# Patient Record
Sex: Female | Born: 1970 | Race: Black or African American | Hispanic: No | Marital: Single | Smoking: Current every day smoker
Health system: Southern US, Community
[De-identification: ages and names within clinical notes are randomized; demographics above are authoritative.]

## PROBLEM LIST (undated history)

## (undated) DIAGNOSIS — Z8719 Personal history of other diseases of the digestive system: Secondary | ICD-10-CM

## (undated) DIAGNOSIS — Z87898 Personal history of other specified conditions: Secondary | ICD-10-CM

## (undated) DIAGNOSIS — I10 Essential (primary) hypertension: Secondary | ICD-10-CM

## (undated) DIAGNOSIS — F32A Depression, unspecified: Secondary | ICD-10-CM

## (undated) DIAGNOSIS — K5900 Constipation, unspecified: Secondary | ICD-10-CM

## (undated) DIAGNOSIS — F329 Major depressive disorder, single episode, unspecified: Secondary | ICD-10-CM

## (undated) HISTORY — DX: Personal history of other diseases of the digestive system: Z87.19

## (undated) HISTORY — DX: Constipation, unspecified: K59.00

## (undated) HISTORY — DX: Personal history of other specified conditions: Z87.898

---

## 1998-11-30 ENCOUNTER — Ambulatory Visit (HOSPITAL_COMMUNITY): Admission: RE | Admit: 1998-11-30 | Discharge: 1998-11-30 | Payer: Self-pay | Admitting: *Deleted

## 1999-01-26 ENCOUNTER — Ambulatory Visit (HOSPITAL_COMMUNITY): Admission: RE | Admit: 1999-01-26 | Discharge: 1999-01-26 | Payer: Self-pay | Admitting: *Deleted

## 1999-04-04 ENCOUNTER — Encounter: Payer: Self-pay | Admitting: Emergency Medicine

## 1999-04-04 ENCOUNTER — Encounter: Payer: Self-pay | Admitting: *Deleted

## 1999-04-04 ENCOUNTER — Observation Stay (HOSPITAL_COMMUNITY): Admission: AD | Admit: 1999-04-04 | Discharge: 1999-04-05 | Payer: Self-pay | Admitting: *Deleted

## 1999-04-05 ENCOUNTER — Encounter: Payer: Self-pay | Admitting: *Deleted

## 1999-06-06 ENCOUNTER — Inpatient Hospital Stay (HOSPITAL_COMMUNITY): Admission: AD | Admit: 1999-06-06 | Discharge: 1999-06-09 | Payer: Self-pay | Admitting: *Deleted

## 1999-06-06 ENCOUNTER — Encounter (HOSPITAL_COMMUNITY): Admission: RE | Admit: 1999-06-06 | Discharge: 1999-06-08 | Payer: Self-pay | Admitting: *Deleted

## 2000-07-17 ENCOUNTER — Inpatient Hospital Stay (HOSPITAL_COMMUNITY): Admission: AD | Admit: 2000-07-17 | Discharge: 2000-07-17 | Payer: Self-pay | Admitting: Obstetrics

## 2000-09-29 ENCOUNTER — Inpatient Hospital Stay (HOSPITAL_COMMUNITY): Admission: AD | Admit: 2000-09-29 | Discharge: 2000-09-29 | Payer: Self-pay | Admitting: *Deleted

## 2000-09-29 ENCOUNTER — Encounter: Payer: Self-pay | Admitting: *Deleted

## 2001-02-11 ENCOUNTER — Inpatient Hospital Stay (HOSPITAL_COMMUNITY): Admission: AD | Admit: 2001-02-11 | Discharge: 2001-02-11 | Payer: Self-pay | Admitting: *Deleted

## 2001-02-17 ENCOUNTER — Inpatient Hospital Stay (HOSPITAL_COMMUNITY): Admission: AD | Admit: 2001-02-17 | Discharge: 2001-02-21 | Payer: Self-pay | Admitting: *Deleted

## 2001-04-02 ENCOUNTER — Encounter: Payer: Self-pay | Admitting: Emergency Medicine

## 2001-04-02 ENCOUNTER — Emergency Department (HOSPITAL_COMMUNITY): Admission: EM | Admit: 2001-04-02 | Discharge: 2001-04-02 | Payer: Self-pay | Admitting: Emergency Medicine

## 2002-09-11 ENCOUNTER — Emergency Department (HOSPITAL_COMMUNITY): Admission: EM | Admit: 2002-09-11 | Discharge: 2002-09-11 | Payer: Self-pay

## 2002-11-11 ENCOUNTER — Ambulatory Visit (HOSPITAL_COMMUNITY): Admission: RE | Admit: 2002-11-11 | Discharge: 2002-11-11 | Payer: Self-pay | Admitting: *Deleted

## 2003-02-25 ENCOUNTER — Inpatient Hospital Stay (HOSPITAL_COMMUNITY): Admission: AD | Admit: 2003-02-25 | Discharge: 2003-02-25 | Payer: Self-pay | Admitting: Obstetrics & Gynecology

## 2003-02-26 ENCOUNTER — Inpatient Hospital Stay (HOSPITAL_COMMUNITY): Admission: AD | Admit: 2003-02-26 | Discharge: 2003-02-26 | Payer: Self-pay | Admitting: *Deleted

## 2003-03-03 ENCOUNTER — Inpatient Hospital Stay (HOSPITAL_COMMUNITY): Admission: AD | Admit: 2003-03-03 | Discharge: 2003-03-03 | Payer: Self-pay | Admitting: Obstetrics and Gynecology

## 2003-03-06 ENCOUNTER — Inpatient Hospital Stay (HOSPITAL_COMMUNITY): Admission: AD | Admit: 2003-03-06 | Discharge: 2003-03-10 | Payer: Self-pay | Admitting: *Deleted

## 2003-08-11 ENCOUNTER — Emergency Department (HOSPITAL_COMMUNITY): Admission: EM | Admit: 2003-08-11 | Discharge: 2003-08-11 | Payer: Self-pay | Admitting: Emergency Medicine

## 2004-01-19 ENCOUNTER — Ambulatory Visit: Payer: Self-pay | Admitting: *Deleted

## 2004-02-09 ENCOUNTER — Ambulatory Visit (HOSPITAL_COMMUNITY): Admission: RE | Admit: 2004-02-09 | Discharge: 2004-02-09 | Payer: Self-pay | Admitting: *Deleted

## 2004-02-09 ENCOUNTER — Ambulatory Visit: Payer: Self-pay | Admitting: *Deleted

## 2004-03-08 ENCOUNTER — Ambulatory Visit: Payer: Self-pay | Admitting: *Deleted

## 2004-03-23 ENCOUNTER — Ambulatory Visit: Payer: Self-pay | Admitting: Family Medicine

## 2004-03-24 ENCOUNTER — Ambulatory Visit: Payer: Self-pay | Admitting: *Deleted

## 2004-04-18 ENCOUNTER — Encounter: Admission: RE | Admit: 2004-04-18 | Discharge: 2004-04-18 | Payer: Self-pay | Admitting: Nephrology

## 2004-04-20 ENCOUNTER — Ambulatory Visit: Payer: Self-pay | Admitting: Family Medicine

## 2004-05-04 ENCOUNTER — Ambulatory Visit: Payer: Self-pay | Admitting: Family Medicine

## 2004-05-05 ENCOUNTER — Ambulatory Visit: Payer: Self-pay | Admitting: Family Medicine

## 2004-05-11 ENCOUNTER — Ambulatory Visit (HOSPITAL_COMMUNITY): Admission: RE | Admit: 2004-05-11 | Discharge: 2004-05-11 | Payer: Self-pay | Admitting: *Deleted

## 2004-05-11 ENCOUNTER — Ambulatory Visit: Payer: Self-pay | Admitting: Family Medicine

## 2004-06-01 ENCOUNTER — Ambulatory Visit: Payer: Self-pay | Admitting: *Deleted

## 2004-06-01 ENCOUNTER — Inpatient Hospital Stay (HOSPITAL_COMMUNITY): Admission: AD | Admit: 2004-06-01 | Discharge: 2004-06-05 | Payer: Self-pay | Admitting: *Deleted

## 2004-06-01 ENCOUNTER — Ambulatory Visit: Payer: Self-pay | Admitting: Obstetrics & Gynecology

## 2004-06-02 ENCOUNTER — Encounter (INDEPENDENT_AMBULATORY_CARE_PROVIDER_SITE_OTHER): Payer: Self-pay | Admitting: Specialist

## 2006-03-21 ENCOUNTER — Ambulatory Visit: Payer: Self-pay | Admitting: Obstetrics & Gynecology

## 2006-03-22 ENCOUNTER — Ambulatory Visit: Payer: Self-pay | Admitting: Gynecology

## 2006-03-26 ENCOUNTER — Ambulatory Visit (HOSPITAL_COMMUNITY): Admission: RE | Admit: 2006-03-26 | Discharge: 2006-03-26 | Payer: Self-pay | Admitting: Obstetrics & Gynecology

## 2006-04-11 ENCOUNTER — Ambulatory Visit: Payer: Self-pay | Admitting: Family Medicine

## 2006-04-11 ENCOUNTER — Ambulatory Visit (HOSPITAL_COMMUNITY): Admission: RE | Admit: 2006-04-11 | Discharge: 2006-04-11 | Payer: Self-pay | Admitting: Family Medicine

## 2006-04-12 ENCOUNTER — Ambulatory Visit: Payer: Self-pay | Admitting: *Deleted

## 2006-04-12 ENCOUNTER — Inpatient Hospital Stay (HOSPITAL_COMMUNITY): Admission: AD | Admit: 2006-04-12 | Discharge: 2006-04-13 | Payer: Self-pay | Admitting: Obstetrics and Gynecology

## 2006-04-13 ENCOUNTER — Encounter (INDEPENDENT_AMBULATORY_CARE_PROVIDER_SITE_OTHER): Payer: Self-pay | Admitting: Specialist

## 2006-04-24 ENCOUNTER — Ambulatory Visit: Payer: Self-pay | Admitting: Gynecology

## 2006-12-18 ENCOUNTER — Inpatient Hospital Stay (HOSPITAL_COMMUNITY): Admission: AD | Admit: 2006-12-18 | Discharge: 2006-12-19 | Payer: Self-pay | Admitting: Obstetrics and Gynecology

## 2006-12-31 ENCOUNTER — Ambulatory Visit (HOSPITAL_COMMUNITY): Admission: RE | Admit: 2006-12-31 | Discharge: 2006-12-31 | Payer: Self-pay | Admitting: Obstetrics & Gynecology

## 2007-01-06 ENCOUNTER — Ambulatory Visit: Payer: Self-pay | Admitting: Obstetrics & Gynecology

## 2007-01-23 ENCOUNTER — Ambulatory Visit: Payer: Self-pay | Admitting: Obstetrics & Gynecology

## 2007-01-24 ENCOUNTER — Ambulatory Visit: Payer: Self-pay | Admitting: Obstetrics and Gynecology

## 2007-02-03 ENCOUNTER — Ambulatory Visit (HOSPITAL_COMMUNITY): Admission: RE | Admit: 2007-02-03 | Discharge: 2007-02-03 | Payer: Self-pay | Admitting: Obstetrics & Gynecology

## 2007-02-27 ENCOUNTER — Ambulatory Visit: Payer: Self-pay | Admitting: Obstetrics & Gynecology

## 2007-03-06 ENCOUNTER — Ambulatory Visit (HOSPITAL_COMMUNITY): Admission: RE | Admit: 2007-03-06 | Discharge: 2007-03-06 | Payer: Self-pay | Admitting: Obstetrics & Gynecology

## 2007-04-17 ENCOUNTER — Ambulatory Visit: Payer: Self-pay | Admitting: Obstetrics & Gynecology

## 2007-05-01 ENCOUNTER — Ambulatory Visit: Payer: Self-pay | Admitting: Obstetrics & Gynecology

## 2007-05-02 ENCOUNTER — Ambulatory Visit: Payer: Self-pay | Admitting: Obstetrics and Gynecology

## 2007-05-07 ENCOUNTER — Inpatient Hospital Stay (HOSPITAL_COMMUNITY): Admission: AD | Admit: 2007-05-07 | Discharge: 2007-05-10 | Payer: Self-pay | Admitting: Obstetrics & Gynecology

## 2007-05-07 ENCOUNTER — Encounter (INDEPENDENT_AMBULATORY_CARE_PROVIDER_SITE_OTHER): Payer: Self-pay | Admitting: Gynecology

## 2007-05-07 ENCOUNTER — Ambulatory Visit: Payer: Self-pay | Admitting: Gynecology

## 2008-10-28 ENCOUNTER — Ambulatory Visit: Payer: Self-pay | Admitting: Obstetrics & Gynecology

## 2008-10-29 ENCOUNTER — Encounter: Payer: Self-pay | Admitting: Family Medicine

## 2008-10-29 ENCOUNTER — Ambulatory Visit (HOSPITAL_COMMUNITY): Admission: RE | Admit: 2008-10-29 | Discharge: 2008-10-29 | Payer: Self-pay | Admitting: Family Medicine

## 2008-11-18 ENCOUNTER — Ambulatory Visit: Payer: Self-pay | Admitting: Obstetrics & Gynecology

## 2008-11-26 ENCOUNTER — Ambulatory Visit (HOSPITAL_COMMUNITY): Admission: RE | Admit: 2008-11-26 | Discharge: 2008-11-26 | Payer: Self-pay | Admitting: Family Medicine

## 2008-11-26 ENCOUNTER — Encounter: Payer: Self-pay | Admitting: Family Medicine

## 2008-12-17 ENCOUNTER — Ambulatory Visit (HOSPITAL_COMMUNITY): Admission: RE | Admit: 2008-12-17 | Discharge: 2008-12-17 | Payer: Self-pay | Admitting: Family Medicine

## 2008-12-30 ENCOUNTER — Encounter: Payer: Self-pay | Admitting: Family Medicine

## 2008-12-30 ENCOUNTER — Ambulatory Visit: Payer: Self-pay | Admitting: Obstetrics & Gynecology

## 2009-01-06 ENCOUNTER — Encounter: Payer: Self-pay | Admitting: Family Medicine

## 2009-01-06 ENCOUNTER — Ambulatory Visit (HOSPITAL_COMMUNITY): Admission: RE | Admit: 2009-01-06 | Discharge: 2009-01-06 | Payer: Self-pay | Admitting: Family Medicine

## 2009-02-17 ENCOUNTER — Ambulatory Visit (HOSPITAL_COMMUNITY): Admission: RE | Admit: 2009-02-17 | Discharge: 2009-02-17 | Payer: Self-pay | Admitting: Family Medicine

## 2009-02-17 ENCOUNTER — Encounter: Payer: Self-pay | Admitting: Family Medicine

## 2009-02-21 ENCOUNTER — Inpatient Hospital Stay (HOSPITAL_COMMUNITY): Admission: AD | Admit: 2009-02-21 | Discharge: 2009-02-21 | Payer: Self-pay | Admitting: Obstetrics & Gynecology

## 2009-02-21 ENCOUNTER — Ambulatory Visit: Payer: Self-pay | Admitting: Advanced Practice Midwife

## 2009-02-21 ENCOUNTER — Ambulatory Visit: Payer: Self-pay | Admitting: Obstetrics & Gynecology

## 2009-03-10 ENCOUNTER — Ambulatory Visit: Payer: Self-pay | Admitting: Obstetrics & Gynecology

## 2009-03-10 ENCOUNTER — Encounter: Payer: Self-pay | Admitting: Family Medicine

## 2009-03-10 ENCOUNTER — Encounter: Payer: Self-pay | Admitting: Obstetrics & Gynecology

## 2009-03-10 LAB — CONVERTED CEMR LAB
MCHC: 32.2 g/dL (ref 30.0–36.0)
MCV: 87.5 fL (ref 78.0–100.0)
Platelets: 350 10*3/uL (ref 150–400)
WBC: 8.4 10*3/uL (ref 4.0–10.5)

## 2009-03-11 ENCOUNTER — Ambulatory Visit: Payer: Self-pay | Admitting: Obstetrics and Gynecology

## 2009-03-31 ENCOUNTER — Ambulatory Visit: Payer: Self-pay | Admitting: Family

## 2009-03-31 ENCOUNTER — Ambulatory Visit: Payer: Self-pay | Admitting: Family Medicine

## 2009-03-31 ENCOUNTER — Inpatient Hospital Stay (HOSPITAL_COMMUNITY): Admission: AD | Admit: 2009-03-31 | Discharge: 2009-04-03 | Payer: Self-pay | Admitting: Obstetrics & Gynecology

## 2009-04-01 ENCOUNTER — Encounter: Payer: Self-pay | Admitting: Obstetrics & Gynecology

## 2009-05-02 ENCOUNTER — Ambulatory Visit: Payer: Self-pay | Admitting: Family Medicine

## 2009-05-02 ENCOUNTER — Ambulatory Visit: Payer: Self-pay | Admitting: Obstetrics and Gynecology

## 2009-05-02 ENCOUNTER — Encounter: Payer: Self-pay | Admitting: Family Medicine

## 2009-05-02 ENCOUNTER — Observation Stay (HOSPITAL_COMMUNITY): Admission: AD | Admit: 2009-05-02 | Discharge: 2009-05-02 | Payer: Self-pay | Admitting: Family Medicine

## 2010-01-29 ENCOUNTER — Encounter: Payer: Self-pay | Admitting: *Deleted

## 2010-03-28 LAB — RAPID URINE DRUG SCREEN, HOSP PERFORMED
Amphetamines: NOT DETECTED
Opiates: NOT DETECTED
Tetrahydrocannabinol: NOT DETECTED

## 2010-03-28 LAB — COMPREHENSIVE METABOLIC PANEL
AST: 23 U/L (ref 0–37)
Albumin: 2.6 g/dL — ABNORMAL LOW (ref 3.5–5.2)
Alkaline Phosphatase: 165 U/L — ABNORMAL HIGH (ref 39–117)
Chloride: 104 mEq/L (ref 96–112)
GFR calc Af Amer: >60 mL/min (ref >60)
Potassium: 4.2 mEq/L (ref 3.5–5.1)
Sodium: 134 mEq/L — ABNORMAL LOW (ref 135–145)
Total Bilirubin: 0.3 mg/dL (ref 0.3–1.2)

## 2010-03-28 LAB — CBC
Platelets: 287 10*3/uL (ref 150–400)
WBC: 7.8 10*3/uL (ref 4.0–10.5)

## 2010-03-28 LAB — POCT URINALYSIS DIP (DEVICE)
Protein, ur: 30 mg/dL — AB
Urobilinogen, UA: 0.2 mg/dL (ref 0.0–1.0)

## 2010-03-31 LAB — URINALYSIS, ROUTINE W REFLEX MICROSCOPIC
Bilirubin Urine: NEGATIVE
Nitrite: NEGATIVE
Specific Gravity, Urine: 1.025 (ref 1.005–1.030)
Urobilinogen, UA: 0.2 mg/dL (ref 0.0–1.0)

## 2010-04-03 LAB — CBC
HCT: 27.5 % — ABNORMAL LOW (ref 36.0–46.0)
HCT: 30.3 % — ABNORMAL LOW (ref 36.0–46.0)
Hemoglobin: 10.2 g/dL — ABNORMAL LOW (ref 12.0–15.0)
Hemoglobin: 9.3 g/dL — ABNORMAL LOW (ref 12.0–15.0)
MCHC: 33.6 g/dL (ref 30.0–36.0)
MCHC: 33.7 g/dL (ref 30.0–36.0)
MCV: 88.3 fL (ref 78.0–100.0)
MCV: 88.4 fL (ref 78.0–100.0)
RDW: 14.1 % (ref 11.5–15.5)
RDW: 14.6 % (ref 11.5–15.5)

## 2010-04-03 LAB — COMPREHENSIVE METABOLIC PANEL
BUN: 4 mg/dL — ABNORMAL LOW (ref 6–23)
BUN: 8 mg/dL (ref 6–23)
Calcium: 7.4 mg/dL — ABNORMAL LOW (ref 8.4–10.5)
Calcium: 9 mg/dL (ref 8.4–10.5)
Creatinine, Ser: 0.46 mg/dL (ref 0.4–1.2)
GFR calc non Af Amer: >60 mL/min (ref >60)
Glucose, Bld: 90 mg/dL (ref 70–99)
Glucose, Bld: 97 mg/dL (ref 70–99)
Sodium: 134 mEq/L — ABNORMAL LOW (ref 135–145)
Total Protein: 5.4 g/dL — ABNORMAL LOW (ref 6.0–8.3)
Total Protein: 6.1 g/dL (ref 6.0–8.3)

## 2010-04-03 LAB — POCT URINALYSIS DIP (DEVICE)
Bilirubin Urine: NEGATIVE
Glucose, UA: NEGATIVE mg/dL
Hgb urine dipstick: NEGATIVE
Nitrite: NEGATIVE
Nitrite: NEGATIVE
Protein, ur: 30 mg/dL — AB
Urobilinogen, UA: 0.2 mg/dL (ref 0.0–1.0)
Urobilinogen, UA: 1 mg/dL (ref 0.0–1.0)
pH: 5.5 (ref 5.0–8.0)
pH: 5.5 (ref 5.0–8.0)

## 2010-04-03 LAB — PROTEIN, URINE, 24 HOUR: Collection Interval-UPROT: 24 hours

## 2010-04-03 LAB — CREATININE, URINE, RANDOM: Creatinine, Urine: 88.5 mg/dL

## 2010-04-03 LAB — RAPID URINE DRUG SCREEN, HOSP PERFORMED
Barbiturates: NOT DETECTED
Benzodiazepines: POSITIVE — AB

## 2010-04-03 LAB — CREATININE CLEARANCE, URINE, 24 HOUR
Collection Interval-CRCL: 24 hours
Creatinine, 24H Ur: 1662 mg/d (ref 700–1800)
Urine Total Volume-CRCL: 2650 mL

## 2010-04-10 LAB — POCT URINALYSIS DIP (DEVICE)
Bilirubin Urine: NEGATIVE
Glucose, UA: NEGATIVE mg/dL
Hgb urine dipstick: NEGATIVE
Specific Gravity, Urine: >=1.03 (ref 1.005–1.030)

## 2010-04-12 LAB — POCT URINALYSIS DIP (DEVICE)
Bilirubin Urine: NEGATIVE
Glucose, UA: NEGATIVE mg/dL
Ketones, ur: NEGATIVE mg/dL
pH: 5.5 (ref 5.0–8.0)

## 2010-04-13 LAB — POCT URINALYSIS DIP (DEVICE)
Bilirubin Urine: NEGATIVE
Ketones, ur: NEGATIVE mg/dL
Protein, ur: NEGATIVE mg/dL
Specific Gravity, Urine: >=1.03 (ref 1.005–1.030)
pH: 5.5 (ref 5.0–8.0)

## 2010-05-23 NOTE — Op Note (Signed)
NAME:  [name]             ACCOUNT NO.:  [id number]   MEDICAL RECORD NO.:  [id number]          PATIENT TYPE:  INP   LOCATION:                                FACILITY:  [facility]   PHYSICIAN:  Ginger Carne, MD  DATE OF BIRTH:  Jun 17, 1970   DATE OF PROCEDURE:  05/07/2007  DATE OF DISCHARGE:                               OPERATIVE REPORT   PREOPERATIVE DIAGNOSES:  1. Intrauterine pregnancy at 38 weeks and 4 days' gestation.  2. Active labor with completely dilated.  3. Breech presentation  4. Chronic hypertension.  5. Advance maternal age.  6. Group B streptococcus unknown.  7. History of preterm labor.   POSTOPERATIVE DIAGNOSES:  1. Intrauterine pregnancy at 38 weeks and 4 days' gestation.  2. Active labor with completely dilated cervix  3. Breech presentation.  4. Chronic hypertension.  5. Advance maternal age.  6. Group B streptococcus unknown.  7. History of preterm labor.   PROCEDURE:  Primary low-transverse cesarean section.   SURGEON:  Ginger Carne, MD   ASSISTANT:  Karlton Lemon, MD   ANESTHESIA:  Spinal.   FINDINGS:  1. Viable infant female, weight unavailable at time of dictation.      Apgars 5 at 1 minute and 8 at 5 minutes.  Cord pH was 7.33.  In      footling breech presentation.  2. Clear amniotic fluid.  3. Normal female pelvic anatomy.   ESTIMATED BLOOD LOSS:  800 mL.   DRAINS:  Foley with clear yellow urine.   COMPLICATIONS:  None immediate.   SPECIMENS:  1. Placenta to the pathology.  2. Cord pH to the lab.  3. Cord blood to the lab.   INDICATIONS FOR PROCEDURE:  [name] is a 40 year old gravida 68,  para 3-3-4-6 at 20 weeks and 4 days' gestation presenting with  complaints of labor.  She was evaluated in the maternity admission unit  and found to be completely dilated with a footling breech presentation.  Due to this presentation, the patient was taken for primary low-  transverse cesarean section.   DESCRIPTION OF  PROCEDURE:  The patient was taken to the operating room  and after obtaining adequate spinal anesthesia, she was prepped and  draped in the usual sterile manner in the supine position with left  lateral uterine displacement.  After ensuring adequate anesthesia, a  Pfannenstiel skin incision was made using the scalpel.  The incision was  carried down through the subcutaneous tissues using the scalpel.  The  rectus fascia was nicked in the midline, and the incision was extended  laterally in its direction using the Mayo scissors.  The rectus muscle  was dissected free of the fascia using both sharp and blunt dissection.  The rectus muscles were then separated bluntly.  The parietal peritoneum  was identified, grasped between the 2 hemostats, elevated and entered  inferiorly under direct visualization with Metzenbaum scissors and blunt  dissection.  A bladder blade was placed and reflection of the visceral  peritoneum superior to the bladder was identified, elevated, and incised  using Metzenbaum scissors, the incision  then extended laterally in each  direction.  Bladder flap was created using blunt dissection and  retracted with the bladder blade.  A low-transverse uterine incision was  made using a scalpel.  The incision was extended laterally  and  superiorly using blunt dissections.  Hand was placed within the uterine  cavity and the infant's legs were identified and delivered without  difficulty.  The infant's torso was then delivered.  The infant's torso  was then rotated, and the infant's right arm was rotated, was swept  across the infant's abdomen and out of the uterine cavity.  The infant  was rotated back to the other side and left arm was delivered in a  similar fashion.  The infant's head followed without difficulty.  The  infant was bulb suctioned after delivery of the head.  Cord was doubly  clamped and cut.  The infant handed to the nursery team in attendance.  Specimens  collected for cord pH and blood, and the placenta was  delivered manually and appeared intact.  The endometrial cavity was then  wiped free of any trace of membranes using wet laparotomy sponges.  The  uterine incision was then closed in a running locking fashion with 0  Vicryl.  The areas of bleeding were controlled with 2 stitches of 0  Vicryl in a figure-of-eight fashion.  The uterine incision was inspected  and found to have good hemostasis.  The rectus muscles were inspected  and found to have adequate hemostasis.  The rectus fascia was then  closed with a single stitch of looped PDS in a running unlocked fashion.  Subcutaneous tissue was inspected and found to have good hemostasis.  The skin was reapproximated with stainless steel skin staples.  Pressure  dressing was applied to the surgical incision.  Sponge, needle, and  instrument counts were correct x3.  The patient was sent to  postanesthesia care unit in stable condition.      Karlton Lemon, MD  Electronically Signed     ______________________________  Ginger Carne, MD    NS/MEDQ  D:  05/07/2007  T:  05/08/2007  Job:  [id number]

## 2010-05-23 NOTE — Discharge Summary (Signed)
NAME:  [name], [name]             ACCOUNT NO.:  [id number]   MEDICAL RECORD NO.:  [id number]          PATIENT TYPE:  INP   LOCATION:  9134                          [facility]ACILITY:  [facility]   PHYSICIAN:  Tanya S. Shawnie Pons, M.D.   DATE OF BIRTH:  04/25/1970   DATE OF ADMISSION:  05/07/2007  DATE OF DISCHARGE:  05/10/2007                               DISCHARGE SUMMARY   The patient is a 40 year old presented as a G11, P 3-3-4-6 on May 07, 2007, in active labor at 30 weeks and 4 days with a footling breech  presentation.  The patient had emergent cesarean section for that same  reason that was performed by Dr. Mia Creek and Karlton Lemon.  A female  infant was born of that cesarean section and details of that surgery can  be found in the dictated discharge summary.  The patient has had an  uneventful postpartum course and postoperative course throughout her  stay.  She is being discharged on postoperative day #3.  Status post a  primary low-transverse cesarean section at 30 weeks and 4 days for  footling breech.  She is in stable status.  Her staples removed by the  baby love nurse.  She is receiving prescriptions for ibuprofen and  Percocet and was instructed to continue her prenatal vitamins for the  duration of her pumping and breast-feeding.  She is receiving Depo-  Provera prior to discharge and plans to receive Mirena IUD, which will  be placed at [facility] at her 6-week visit.  She was  given instructions for following up at [facility] in 6 weeks and she  had a 30-week infant that is stable currently in the NICU, that will not  be discharged for several weeks.  Her discharge hemoglobin is 9.5 and  hematocrit is 27.5.  The patient was also given a prescription for iron  as well because of her acute blood loss in surgery and new onset anemia.      Maylon Cos, C.N.M.      Shelbie Proctor. Shawnie Pons, M.D.  Electronically Signed    SS/MEDQ  D:  05/10/2007  T:  05/10/2007   Job:  [id number]

## 2010-05-26 NOTE — Discharge Summary (Signed)
[facility]  Patient:    [name], [name] Visit Number: [id number] MRN: [id number]          Service Type: OBS Location: 910A 9144 01 Attending Physician:  Michaelle Copas Dictated by:   Vear Clock, M.D. Admit Date:  02/17/2001 Discharge Date: 02/21/2001                             Discharge Summary  CHIEF COMPLAINT:              1. Early labor.                               2. Hypertension.  DISCHARGE MEDICATIONS:        1. Ibuprofen 600 mg p.o. q.6-8h. p.r.n. pain.                               2. Procardia XL 30 mg p.o. q.d.                               3. Depo-Provera 150 mg IM every three months.                               4. Prenatal vitamins 1 p.o. q.d. x6 weeks or                                  while breast feeding.                               5. Fergon 325 mg p.o. q.h.s. x6 weeks.  FOLLOWUP:                     The patient is to follow up at [facility] on March 31, 2001 at 9 a.m.  HISTORY OF PRESENT ILLNESS:   This is a 40 year old G7, P3-0-3-3 who presented at [redacted] weeks gestation complaining of contractions, membranes intact.  The patient had had painful contractions since 0300.  She receives her care at [facility].  The patient has a history of PIH, anemia, smoking, and GBS positivity.  The patient did have hypertension with her third pregnancy and with this pregnancy and history also significant for an abnormal Pap in 1992, questionable significant atypia versus benign.  The patient does smoke three cigarettes a day.  PRENATAL LABORATORY DATA:     O+, rubella immune, hepatitis B surface antigen negative, RPR negative, HIV declined, GC negative, chlamydia negative, GBS positive on November 15.  PHYSICAL EXAMINATION:  VITAL SIGNS:                  Stable except for elevated blood pressures of 163/97-153/109.  CARDIOVASCULAR:               Regular rate and rhythm.  LUNGS:                         Clear to auscultation bilaterally.  ABDOMEN:  Soft and nontender.  Mid right upper quadrant pain.  EXTREMITIES:                  Mild pedal edema.  NEUROLOGIC:                   DTRs 1+, no clonus.  PELVIS:                       Cervical exam 2-3, 60%, -2, and vertex.  Fetal heart rate with reactivity variability.  No decelerations.  Contracting on the monitor every 4-5 minutes.  LABORATORY DATA:              PIH labs were drawn at admission and were within normal limits.  UA did show some new proteinuria.  ASSESSMENT:                   A 40 year old G7, P3-0-3-3 at [redacted] weeks gestation in early labor with hypertension.  HOSPITAL COURSE:              The patient was admitted, began having discomfort with her contractions, did receive an epidural when she went into active labor, and magnesium sulfate was started at that time.  The patient progressed through labor well.  Delivered a viable female infant on February 10 at 11:43.  Apgars 9 and 9.  The patient was on magnesium for approximately 28 hours after delivery and did well after discontinuation of magnesium; however, did continue with some hypertension and headaches.  Baby had some difficulty in the postpartum period and was transferred to the NICU for two days, causing significant distress to mother, delaying her discharge for an additional two days secondary to continued hypertension.  The patient was started on Procardia XL 30 mg p.o. q.d. with improvement in her hypertension.  The patient is breast feeding and wants Depo-Provera prior to discharge.  The patient was discharged on hospital day #4 without further incident and will follow up at [facility] in six weeks. Dictated by:   Vear Clock, M.D. Attending Physician:  Michaelle Copas DD:  02/21/01 TD:  02/22/01 Job: [id number]

## 2010-05-26 NOTE — Discharge Summary (Signed)
NAME:  [name], [name]             ACCOUNT NO.:  [id number]   MEDICAL RECORD NO.:  [id number]          PATIENT TYPE:  WOC   LOCATION:  WOC                          FACILITY:  [facility]   PHYSICIAN:  Phil D. Okey Dupre, M.D.     DATE OF BIRTH:  [birth year]   DATE OF ADMISSION:  04/11/2006  DATE OF DISCHARGE:  04/13/2006                               DISCHARGE SUMMARY   DISCHARGE DIAGNOSIS:  Intrauterine fetal demise at 43 weeks' gestation,  etiology unknown but likely a cord accident.   LABORATORY:  O positive.  Antibody negative.  Rubella immune.  Hep B  negative.  HIV nonreactive.  Pap negative.  Predelivery hemoglobin 12.1,  post delivery 9.8.   HOSPITAL COURSE:  A 40 year old G10, P6-3-4-6, at 52 weeks' gestational  age was seen for a routine OB visit and fetal heart tones could not be  found.  Followup ultrasound revealed an IUFD.  The patient was admitted  and given Cytotec x2 and went on to a vaginal delivery encaul nonviable  female at 4:30 p.m. on April 12, 2006, that was delivered by the nurse.  Per nurse report, there was a tight nuchal cord that was clamped and cut  by the father of the baby.  Three-vessel placenta was delivered by Dr.  Mayford Knife intact.  Bimanual done to clear the clots.  No D&C was  required.  The fetus appeared normal with no overt deformities or  anomalies.  The patient was initially requesting an autopsy but decided  against this given unlikely that this would reveal etiology other than  probable cord accident.  The patient has good support and was given  numbers for counseling services.  She desired to follow up with the  [facility] for her six-week postpartum check and  a telephone number was given for her to call.   She was advised of her postpartum recommendations including six weeks of  pelvic rest and her prenatal vitamin.  She desires to use OCPs and has  some Yaz at home that she was instructed to start on April 28, 2006.  All  questions were answered.     ______________________________  Lupita Raider, M.D.    ______________________________  Javier Glazier. Okey Dupre, M.D.    KS/MEDQ  D:  04/13/2006  T:  04/13/2006  Job:  [id number]
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
## 2010-09-29 LAB — POCT URINALYSIS DIP (DEVICE)
Bilirubin Urine: NEGATIVE
Operator id: 159681
Protein, ur: 30 — AB
Specific Gravity, Urine: >=1.03

## 2010-10-02 LAB — POCT URINALYSIS DIP (DEVICE)
Bilirubin Urine: NEGATIVE
Ketones, ur: NEGATIVE
Operator id: 297281
Protein, ur: 30 — AB
Specific Gravity, Urine: >=1.03

## 2010-10-03 LAB — CBC
HCT: 34.4 — ABNORMAL LOW
Hemoglobin: 9.5 — ABNORMAL LOW
MCHC: 34.6
MCV: 87.7
MCV: 88.2
Platelets: 323
RBC: 3.13 — ABNORMAL LOW
RBC: 3.9
WBC: 10.8 — ABNORMAL HIGH
WBC: 13.2 — ABNORMAL HIGH

## 2010-10-03 LAB — POCT URINALYSIS DIP (DEVICE)
Bilirubin Urine: NEGATIVE
Bilirubin Urine: NEGATIVE
Glucose, UA: NEGATIVE
Glucose, UA: NEGATIVE
Hgb urine dipstick: NEGATIVE
Hgb urine dipstick: NEGATIVE
Ketones, ur: NEGATIVE
Nitrite: NEGATIVE
Nitrite: NEGATIVE
Operator id: 148111
Operator id: 297281
Protein, ur: 30 — AB
Protein, ur: 30 — AB
Specific Gravity, Urine: 1.025
Specific Gravity, Urine: 1.025
Urobilinogen, UA: 1
Urobilinogen, UA: 0.2
pH: 6
pH: 6

## 2010-10-13 LAB — POCT URINALYSIS DIP (DEVICE)
Glucose, UA: NEGATIVE
Hgb urine dipstick: NEGATIVE
Ketones, ur: NEGATIVE
Operator id: 120861
Protein, ur: 30 — AB
Specific Gravity, Urine: >=1.03
Urobilinogen, UA: 0.2

## 2015-02-18 ENCOUNTER — Encounter (HOSPITAL_COMMUNITY): Payer: Self-pay | Admitting: *Deleted

## 2015-02-18 ENCOUNTER — Emergency Department (HOSPITAL_COMMUNITY)
Admission: EM | Admit: 2015-02-18 | Discharge: 2015-02-18 | Disposition: A | Discharge status: Home / Self Care | Payer: Medicaid Other | Attending: Emergency Medicine | Admitting: Emergency Medicine

## 2015-02-18 DIAGNOSIS — I10 Essential (primary) hypertension: Secondary | ICD-10-CM | POA: Insufficient documentation

## 2015-02-18 DIAGNOSIS — F17200 Nicotine dependence, unspecified, uncomplicated: Secondary | ICD-10-CM | POA: Diagnosis not present

## 2015-02-18 DIAGNOSIS — A5901 Trichomonal vulvovaginitis: Secondary | ICD-10-CM | POA: Insufficient documentation

## 2015-02-18 DIAGNOSIS — Z79899 Other long term (current) drug therapy: Secondary | ICD-10-CM | POA: Diagnosis not present

## 2015-02-18 DIAGNOSIS — Z9889 Other specified postprocedural states: Secondary | ICD-10-CM | POA: Insufficient documentation

## 2015-02-18 DIAGNOSIS — F329 Major depressive disorder, single episode, unspecified: Secondary | ICD-10-CM | POA: Insufficient documentation

## 2015-02-18 DIAGNOSIS — R000 Tachycardia, unspecified: Secondary | ICD-10-CM

## 2015-02-18 DIAGNOSIS — K5900 Constipation, unspecified: Secondary | ICD-10-CM | POA: Diagnosis not present

## 2015-02-18 DIAGNOSIS — Z3202 Encounter for pregnancy test, result negative: Secondary | ICD-10-CM | POA: Insufficient documentation

## 2015-02-18 DIAGNOSIS — R109 Unspecified abdominal pain: Secondary | ICD-10-CM | POA: Diagnosis present

## 2015-02-18 HISTORY — DX: Major depressive disorder, single episode, unspecified: F32.9

## 2015-02-18 HISTORY — DX: Essential (primary) hypertension: I10

## 2015-02-18 HISTORY — DX: Depression, unspecified: F32.A

## 2015-02-18 LAB — URINALYSIS, ROUTINE W REFLEX MICROSCOPIC
BILIRUBIN URINE: NEGATIVE
Glucose, UA: NEGATIVE mg/dL
KETONES UR: NEGATIVE mg/dL
NITRITE: NEGATIVE
Protein, ur: 30 mg/dL — AB
SPECIFIC GRAVITY, URINE: 1.012 (ref 1.005–1.030)
pH: 6.5 (ref 5.0–8.0)

## 2015-02-18 LAB — CBC
HCT: 38.4 % (ref 36.0–46.0)
HEMOGLOBIN: 12.6 g/dL (ref 12.0–15.0)
MCH: 28.4 pg (ref 26.0–34.0)
MCHC: 32.8 g/dL (ref 30.0–36.0)
MCV: 86.7 fL (ref 78.0–100.0)
Platelets: 345 10*3/uL (ref 150–400)
RBC: 4.43 MIL/uL (ref 3.87–5.11)
RDW: 14.4 % (ref 11.5–15.5)
WBC: 10.4 10*3/uL (ref 4.0–10.5)

## 2015-02-18 LAB — URINE MICROSCOPIC-ADD ON

## 2015-02-18 LAB — COMPREHENSIVE METABOLIC PANEL
ALBUMIN: 3.4 g/dL — AB (ref 3.5–5.0)
ALK PHOS: 113 U/L (ref 38–126)
ALT: 65 U/L — ABNORMAL HIGH (ref 14–54)
ANION GAP: 13 (ref 5–15)
AST: 59 U/L — ABNORMAL HIGH (ref 15–41)
BUN: 9 mg/dL (ref 6–20)
CALCIUM: 10.3 mg/dL (ref 8.9–10.3)
CO2: 25 mmol/L (ref 22–32)
Chloride: 98 mmol/L — ABNORMAL LOW (ref 101–111)
Creatinine, Ser: 0.78 mg/dL (ref 0.44–1.00)
GFR calc Af Amer: >60 mL/min (ref >60)
GLUCOSE: 167 mg/dL — AB (ref 65–99)
Potassium: 3.5 mmol/L (ref 3.5–5.1)
Sodium: 136 mmol/L (ref 135–145)
TOTAL PROTEIN: 8.8 g/dL — AB (ref 6.5–8.1)

## 2015-02-18 LAB — RAPID URINE DRUG SCREEN, HOSP PERFORMED
Amphetamines: NOT DETECTED
BARBITURATES: NOT DETECTED
Benzodiazepines: NOT DETECTED
COCAINE: NOT DETECTED
Opiates: NOT DETECTED
TETRAHYDROCANNABINOL: NOT DETECTED

## 2015-02-18 LAB — POC URINE PREG, ED: PREG TEST UR: NEGATIVE

## 2015-02-18 MED ORDER — METRONIDAZOLE 500 MG PO TABS: 500.0000 mg | ORAL_TABLET | Freq: Two times a day (BID) | ORAL | Status: DC

## 2015-02-18 MED ORDER — POLYETHYLENE GLYCOL 3350 17 GM/SCOOP PO POWD: ORAL | Status: DC

## 2015-02-18 MED ORDER — DOCUSATE SODIUM 100 MG PO CAPS: 100.0000 mg | ORAL_CAPSULE | Freq: Every day | ORAL | Status: DC

## 2015-02-18 MED ORDER — SODIUM CHLORIDE 0.9 % IV BOLUS (SEPSIS)
1000.0000 mL | Freq: Once | INTRAVENOUS | Status: AC
  Administered 2015-02-18: 1000 mL via INTRAVENOUS

## 2015-02-18 NOTE — ED Notes (Signed)
Pt states she is nervous, she smoked a cigarette before coming to the room, and her HR is normally high.  PT placed on 5 lead for monitoring.

## 2015-02-18 NOTE — ED Notes (Signed)
Patient presents with c/o abd pain and constipation.  Stated she took an Exlax on Monday and it helped some

## 2015-02-18 NOTE — Discharge Instructions (Signed)

## 2015-02-18 NOTE — ED Provider Notes (Signed)
CSN: [id number]     Arrival date & time 02/18/15  0104 History   By signing my name below, I, Arlan Organ, attest that this documentation has been prepared under the direction and in the presence of Laurence Spates, MD.  Electronically Signed: Arlan Organ, ED Scribe. 02/18/2015. 3:41 AM.   Chief Complaint  Patient presents with  . Abdominal Pain  . Constipation   The history is provided by the patient. No language interpreter was used.    HPI Comments: [name] is a 45 y.o. female with a PMHx of HTN who presents to the Emergency Department complaining of constant, ongoing left-sided abdominal pain x 3 days. Pt also reports ongoing constipation. No aggravating or alleviating factors at this time. [name] admits to consuming a lot of caffeine today. Pt attempted OTC Exlax at home with some improvement. No recent fever, chills, nausea, cough, vomiting, vaginal discharge, chest pain, pelvic pain, shortness of breath or dysuria. She denies any heart racing sensation. Pt states she had a large bowel movement in triage prior to coming to her room and symptoms are now resolved. She denies any illicit drug use/alcohol consumption but states she is an every day smoker.  PCP: PROVIDER NOT IN SYSTEM    Past Medical History  Diagnosis Date  . Hypertension   . Depression    Past Surgical History  Procedure Laterality Date  . Cesarean section     No family history on file. Social History  Substance Use Topics  . Smoking status: Current Every Day Smoker  . Smokeless tobacco: Never Used  . Alcohol Use: No   OB History    No data available     Review of Systems  A complete 10 system review of systems was obtained and all systems are negative except as noted in the HPI and PMH.    Allergies  Review of patient's allergies indicates no known allergies.  Home Medications   Prior to Admission medications   Medication Sig Start Date End Date Taking? Authorizing Provider   Aspirin-Salicylamide-Caffeine (BC HEADACHE POWDER PO) Take 1 packet by mouth as needed (for pain).   Yes Historical Provider, MD  risperiDONE (RISPERDAL) 1 MG tablet Take 1 mg by mouth at bedtime. 01/22/15  Yes Historical Provider, MD  sertraline (ZOLOFT) 100 MG tablet Take 200 mg by mouth daily with breakfast. 12/24/14  Yes Historical Provider, MD  docusate sodium (COLACE) 100 MG capsule Take 1 capsule (100 mg total) by mouth daily. 02/18/15   Laurence Spates, MD  metroNIDAZOLE (FLAGYL) 500 MG tablet Take 1 tablet (500 mg total) by mouth 2 (two) times daily. 02/18/15   Laurence Spates, MD  polyethylene glycol powder (GLYCOLAX/MIRALAX) powder Mix 1 capful into a drink and take by mouth 1 to 3 times daily as needed for soft stools 02/18/15   Laurence Spates, MD   Triage Vitals: BP 133/88 mmHg  Pulse 109  Temp(Src) 97.5 F (36.4 C) (Oral)  Resp 18  Ht  (1.6 m)  Wt 305 lb (138.347 kg)  BMI 54.04 kg/m2  SpO2 93%  LMP  (LMP Unknown)   Physical Exam  Constitutional: She is oriented to person, place, and time. She appears well-developed and well-nourished. No distress.  HENT:  Head: Normocephalic and atraumatic.  Moist mucous membranes  Eyes: Conjunctivae are normal. Pupils are equal, round, and reactive to light.  Neck: Neck supple.  Cardiovascular: Regular rhythm and normal heart sounds.  Tachycardia present.   No  murmur heard. Pulmonary/Chest: Effort normal and breath sounds normal.  Abdominal: Soft. Bowel sounds are normal. She exhibits no distension. There is no tenderness.  Musculoskeletal: She exhibits no edema.  Neurological: She is alert and oriented to person, place, and time.  Fluent speech  Skin: Skin is warm and dry.  Psychiatric: She has a normal mood and affect. Judgment normal.  Nursing note and vitals reviewed.   ED Course  Procedures (including critical care time)  DIAGNOSTIC STUDIES: Oxygen Saturation is 97% on RA, adequate by my interpretation.     COORDINATION OF CARE: 3:31 AM- Will order urinalysis, CBC, CMP, urinalysis, EKG, and urine pregnancy. Discussed treatment plan with pt at bedside and pt agreed to plan.     Labs Review Labs Reviewed  COMPREHENSIVE METABOLIC PANEL - Abnormal; Notable for the following:    Chloride 98 (*)    Glucose, Bld 167 (*)    Total Protein 8.8 (*)    Albumin 3.4 (*)    AST 59 (*)    ALT 65 (*)    Total Bilirubin <0.1 (*)    All other components within normal limits  URINALYSIS, ROUTINE W REFLEX MICROSCOPIC (NOT AT [facility]) - Abnormal; Notable for the following:    APPearance TURBID (*)    Hgb urine dipstick TRACE (*)    Protein, ur 30 (*)    Leukocytes, UA LARGE (*)    All other components within normal limits  URINE MICROSCOPIC-ADD ON - Abnormal; Notable for the following:    Squamous Epithelial / LPF TOO NUMEROUS TO COUNT (*)    Bacteria, UA MANY (*)    All other components within normal limits  CBC  URINE RAPID DRUG SCREEN, HOSP PERFORMED  POC URINE PREG, ED    Imaging Review No results found. I have personally reviewed and evaluated these lab results as part of my medical decision-making.   EKG Interpretation   Date/Time:  Friday February 18 2015 04:08:35 EST Ventricular Rate:  117 PR Interval:  135 QRS Duration: 93 QT Interval:  323 QTC Calculation: 451 R Axis:   75 Text Interpretation:  Sinus tachycardia No significant change since last  tracing Confirmed by Deshawnda Acrey MD, Enoch Moffa [phone]) on 02/18/2015 5:14:42 AM     Medications  sodium chloride 0.9 % bolus 1,000 mL (0 mLs Intravenous Stopped 02/18/15 0521)    MDM   Final diagnoses:  Constipation, unspecified constipation type  Tachycardia  Trichomonal vaginitis   Patient presents with several days of left-sided abdominal pain associated with constipation. She reports that she had a large bowel movement in the waiting room just prior to evaluation and states that her abdominal pain has resolved. On exam, she was awake,  alert, comfortable. She was mildly hypertensive, she was tachycardic to the 120s and 130s, up to 160s during conversation. Obtained EKG which showed sinus rhythm without ischemic changes. Patient states that she has a long history of tachycardia. She denies any chest pain, shortness of breath, or palpitations. Gave the patient an IV fluid bolus. Labs show mildly elevated LFTs but patient has no right upper quadrant tenderness and denies any abdominal pain. UA is contaminated but does show Trichomonas. Discussed these results with the patient and provided with prescription for Flagyl. Regarding her tachycardia, observe the patient for several hours and her tachycardia was improved after fluid bolus. On reexamination, her heart rate was around 110 and she again denied complaints. Because she is asymptomatic and endorses long history of tachycardia, I feel she is safe  for discharge home but I emphasized the importance of close PCP follow-up for consideration of beta blockade. Counseled patient on home treatment of constipation. Patient voiced understanding of plan as well as return precautions. Patient discharged in satisfactory condition.  I personally performed the services described in this documentation, which was scribed in my presence. The recorded information has been reviewed and is accurate.   Laurence Spates, MD 02/18/15 [phone]

## 2015-02-18 NOTE — ED Notes (Signed)
Pt sts she had a large bowel movement before being brought back to the room, and pain is now resolving.

## 2015-09-19 ENCOUNTER — Emergency Department (HOSPITAL_COMMUNITY)
Admission: EM | Admit: 2015-09-19 | Discharge: 2015-09-19 | Disposition: A | Discharge status: Home / Self Care | Payer: Medicaid Other | Attending: Emergency Medicine | Admitting: Emergency Medicine

## 2015-09-19 ENCOUNTER — Emergency Department (HOSPITAL_COMMUNITY): Payer: Medicaid Other

## 2015-09-19 ENCOUNTER — Encounter (HOSPITAL_COMMUNITY): Payer: Self-pay

## 2015-09-19 DIAGNOSIS — Z7982 Long term (current) use of aspirin: Secondary | ICD-10-CM | POA: Insufficient documentation

## 2015-09-19 DIAGNOSIS — F17200 Nicotine dependence, unspecified, uncomplicated: Secondary | ICD-10-CM | POA: Insufficient documentation

## 2015-09-19 DIAGNOSIS — K5900 Constipation, unspecified: Secondary | ICD-10-CM

## 2015-09-19 DIAGNOSIS — R7989 Other specified abnormal findings of blood chemistry: Secondary | ICD-10-CM

## 2015-09-19 DIAGNOSIS — N390 Urinary tract infection, site not specified: Secondary | ICD-10-CM

## 2015-09-19 DIAGNOSIS — R739 Hyperglycemia, unspecified: Secondary | ICD-10-CM

## 2015-09-19 DIAGNOSIS — R945 Abnormal results of liver function studies: Secondary | ICD-10-CM | POA: Insufficient documentation

## 2015-09-19 DIAGNOSIS — R160 Hepatomegaly, not elsewhere classified: Secondary | ICD-10-CM | POA: Diagnosis not present

## 2015-09-19 DIAGNOSIS — R1084 Generalized abdominal pain: Secondary | ICD-10-CM | POA: Diagnosis present

## 2015-09-19 DIAGNOSIS — K760 Fatty (change of) liver, not elsewhere classified: Secondary | ICD-10-CM

## 2015-09-19 DIAGNOSIS — I10 Essential (primary) hypertension: Secondary | ICD-10-CM

## 2015-09-19 DIAGNOSIS — R110 Nausea: Secondary | ICD-10-CM

## 2015-09-19 LAB — COMPREHENSIVE METABOLIC PANEL
ALT: 59 U/L — ABNORMAL HIGH (ref 14–54)
AST: 50 U/L — AB (ref 15–41)
Albumin: 3.8 g/dL (ref 3.5–5.0)
Alkaline Phosphatase: 91 U/L (ref 38–126)
Anion gap: 14 (ref 5–15)
BUN: 8 mg/dL (ref 6–20)
CHLORIDE: 100 mmol/L — AB (ref 101–111)
CO2: 21 mmol/L — AB (ref 22–32)
Calcium: 9.7 mg/dL (ref 8.9–10.3)
Creatinine, Ser: 0.78 mg/dL (ref 0.44–1.00)
Glucose, Bld: 160 mg/dL — ABNORMAL HIGH (ref 65–99)
POTASSIUM: 3.7 mmol/L (ref 3.5–5.1)
SODIUM: 135 mmol/L (ref 135–145)
TOTAL PROTEIN: 9 g/dL — AB (ref 6.5–8.1)
Total Bilirubin: 0.5 mg/dL (ref 0.3–1.2)

## 2015-09-19 LAB — URINE MICROSCOPIC-ADD ON

## 2015-09-19 LAB — URINALYSIS, ROUTINE W REFLEX MICROSCOPIC
Bilirubin Urine: NEGATIVE
Glucose, UA: NEGATIVE mg/dL
KETONES UR: NEGATIVE mg/dL
NITRITE: POSITIVE — AB
PH: 6 (ref 5.0–8.0)
Protein, ur: NEGATIVE mg/dL
SPECIFIC GRAVITY, URINE: 1.025 (ref 1.005–1.030)

## 2015-09-19 LAB — CBC
HEMATOCRIT: 39.5 % (ref 36.0–46.0)
HEMOGLOBIN: 12.8 g/dL (ref 12.0–15.0)
MCH: 28.7 pg (ref 26.0–34.0)
MCHC: 32.4 g/dL (ref 30.0–36.0)
MCV: 88.6 fL (ref 78.0–100.0)
Platelets: 339 10*3/uL (ref 150–400)
RBC: 4.46 MIL/uL (ref 3.87–5.11)
RDW: 14.4 % (ref 11.5–15.5)
WBC: 7.5 10*3/uL (ref 4.0–10.5)

## 2015-09-19 LAB — I-STAT BETA HCG BLOOD, ED (MC, WL, AP ONLY)

## 2015-09-19 LAB — LIPASE, BLOOD: LIPASE: 22 U/L (ref 11–51)

## 2015-09-19 MED ORDER — ONDANSETRON 4 MG PO TBDP: 4.0000 mg | ORAL_TABLET | Freq: Three times a day (TID) | ORAL | 0 refills | Status: DC | PRN

## 2015-09-19 MED ORDER — SODIUM CHLORIDE 0.9 % IV BOLUS (SEPSIS)
1000.0000 mL | Freq: Once | INTRAVENOUS | Status: AC
  Administered 2015-09-19: 1000 mL via INTRAVENOUS

## 2015-09-19 MED ORDER — KETOROLAC TROMETHAMINE 30 MG/ML IJ SOLN
30.0000 mg | Freq: Once | INTRAMUSCULAR | Status: AC
  Administered 2015-09-19: 30 mg via INTRAVENOUS
  Filled 2015-09-19: qty 1

## 2015-09-19 MED ORDER — ONDANSETRON HCL 4 MG/2ML IJ SOLN
4.0000 mg | Freq: Once | INTRAMUSCULAR | Status: AC
  Administered 2015-09-19: 4 mg via INTRAVENOUS
  Filled 2015-09-19: qty 2

## 2015-09-19 MED ORDER — IOPAMIDOL (ISOVUE-300) INJECTION 61%
INTRAVENOUS | Status: AC
  Administered 2015-09-19: 100 mL
  Filled 2015-09-19: qty 100

## 2015-09-19 MED ORDER — CEPHALEXIN 500 MG PO CAPS: 500.0000 mg | ORAL_CAPSULE | Freq: Two times a day (BID) | ORAL | 0 refills | Status: DC

## 2015-09-19 NOTE — ED Triage Notes (Signed)
Patient complains of generalized abdominal pain with nausea and constipation x 2 weeks. Denies urinary symptoms.  No vomiting, no hx of abdominal problems

## 2015-09-19 NOTE — Discharge Instructions (Signed)
Your work up today was reassuring, aside from showing that your liver is fatty and enlarged. You will need to follow up with your primary care doctor for this, to have ongoing management and work up for this. Your urine also shows signs of infection, take antibiotic as directed and until completed to treat for this. Stay well hydrated. Use tylenol or motrin as needed for pain. Increase fiber and water intake in your diet to help with constipation, may consider over the counter miralax daily for stool softening but if you develop loose stools then decrease dose to every other day or however often you need to take it to continue having daily soft stools. Follow up with your regular doctor in 5-7 days for recheck of symptoms and ongoing management of your abdominal pain. Return to the ER for changes or worsening symptoms.   Abdominal (belly) pain can be caused by many things. Your caregiver performed an examination and possibly ordered blood/urine tests and imaging (CT scan, x-rays, ultrasound). Many cases can be observed and treated at home after initial evaluation in the emergency department. Even though you are being discharged home, abdominal pain can be unpredictable. Therefore, you need a repeated exam if your pain does not resolve, returns, or worsens. Most patients with abdominal pain don't have to be admitted to the hospital or have surgery, but serious problems like appendicitis and gallbladder attacks can start out as nonspecific pain. Many abdominal conditions cannot be diagnosed in one visit, so follow-up evaluations are very important. SEEK IMMEDIATE MEDICAL ATTENTION IF YOU DEVELOP ANY OF THE FOLLOWING SYMPTOMS: The pain does not go away or becomes severe.  A temperature above 101 develops.  Repeated vomiting occurs (multiple episodes).  The pain becomes localized to portions of the abdomen. The right side could possibly be appendicitis. In an adult, the left lower portion of the abdomen could be  colitis or diverticulitis.  Blood is being passed in stools or vomit (bright red or black tarry stools).  Return also if you develop chest pain, difficulty breathing, dizziness or fainting, or become confused, poorly responsive, or inconsolable (young children). The constipation stays for more than 4 days.  There is belly (abdominal) or rectal pain.  You do not seem to be getting better.

## 2015-09-19 NOTE — ED Provider Notes (Signed)
[facility]-EMERGENCY DEPT Provider Note   CSN: [id number] Arrival date & time: 09/19/15  0901     History   Chief Complaint Chief Complaint  Patient presents with  . Abdominal Pain    HPI [name] is a 45 y.o. female with a PMHx of HTN, who presents to the ED with complaints of generalized abdominal pain 3 weeks after eating pig's feet. She states that after she consumed the pig's feet, "my stomach ain't been right since". She reports that she is also had some constipation, used magnesium citrate yesterday and produced a small soft bowel movement but still feels like she needs to have a bowel movement. She describes her abdominal pain is 10/10 generalized intermittent nonradiating sharp pain worse with needing have a bowel movement and somewhat improved with BC powders and Aleve. Associated symptoms include constipation and nausea. Chart review reveals she had a similar complaint/presentation of abd pain/constipation in Feb 2017 but was able to have a BM in triage which releived her symptoms. She denies ever having other issues with her GI system. No longer has menstrual cycle, LMP was about 3 years ago. She has had 2 prior C-sections, no other prior abdominal surgeries.  She denies any fevers, chills, chest pain, shortness breath, melena, hematochezia, vomiting, diarrhea, obstipation, dysuria, hematuria, vaginal bleeding or discharge, numbness, tingling, focal weakness, recent travel, sick contacts, alcohol use, or chronic NSAID use. She states she's very anxious about what may be wrong with her.    The history is provided by the patient and medical records. No language interpreter was used.  Abdominal Pain   This is a recurrent problem. The current episode started more than 1 week ago. Episode frequency: intermittent. The problem has not changed since onset.The pain is associated with suspicious food intake. The pain is located in the generalized abdominal region. The quality of the pain  is sharp. The pain is at a severity of 10/10. The pain is severe. Associated symptoms include nausea and constipation. Pertinent negatives include fever, diarrhea, flatus, hematochezia, melena, vomiting, dysuria, hematuria, arthralgias and myalgias. The symptoms are aggravated by bowel movements. The symptoms are relieved by NSAIDs.    Past Medical History:  Diagnosis Date  . Depression   . Hypertension     There are no active problems to display for this patient.   Past Surgical History:  Procedure Laterality Date  . CESAREAN SECTION      OB History    No data available       Home Medications    Prior to Admission medications   Medication Sig Start Date End Date Taking? Authorizing Provider  Aspirin-Salicylamide-Caffeine (BC HEADACHE POWDER PO) Take 1 packet by mouth as needed (for pain).    Historical Provider, MD  docusate sodium (COLACE) 100 MG capsule Take 1 capsule (100 mg total) by mouth daily. 02/18/15   Laurence Spatesachel Morgan Little, MD  metroNIDAZOLE (FLAGYL) 500 MG tablet Take 1 tablet (500 mg total) by mouth 2 (two) times daily. 02/18/15   Laurence Spatesachel Morgan Little, MD  polyethylene glycol powder (GLYCOLAX/MIRALAX) powder Mix 1 capful into a drink and take by mouth 1 to 3 times daily as needed for soft stools 02/18/15   Laurence Spatesachel Morgan Little, MD  risperiDONE (RISPERDAL) 1 MG tablet Take 1 mg by mouth at bedtime. 01/22/15   Historical Provider, MD  sertraline (ZOLOFT) 100 MG tablet Take 200 mg by mouth daily with breakfast. 12/24/14   Historical Provider, MD    Family History No family history  on file.  Social History Social History  Substance Use Topics  . Smoking status: Current Every Day Smoker  . Smokeless tobacco: Never Used  . Alcohol use No     Allergies   Review of patient's allergies indicates no known allergies.   Review of Systems Review of Systems  Constitutional: Negative for chills and fever.  Respiratory: Negative for shortness of breath.     Cardiovascular: Negative for chest pain.  Gastrointestinal: Positive for abdominal pain, constipation and nausea. Negative for anal bleeding, blood in stool, diarrhea, flatus, hematochezia, melena and vomiting.  Genitourinary: Negative for dysuria, hematuria, vaginal bleeding and vaginal discharge.  Musculoskeletal: Negative for arthralgias and myalgias.  Skin: Negative for color change.  Allergic/Immunologic: Negative for immunocompromised state.  Neurological: Negative for weakness and numbness.  Psychiatric/Behavioral: Negative for confusion.   10 Systems reviewed and are negative for acute change except as noted in the HPI.   Physical Exam Updated Vital Signs BP 162/100 (BP Location: Left Arm)   Pulse 106   Temp 97.5 F (36.4 C) (Oral)   Resp 18   Ht 5\' 3"  (1.6 m)   Wt 136.1 kg   SpO2 100%   BMI 53.14 kg/m   Physical Exam  Constitutional: She is oriented to person, place, and time. Vital signs are normal. She appears well-developed and well-nourished.  Non-toxic appearance. No distress.  Afebrile, nontoxic, NAD although anxious, holding her family's hands who are at bedside  HENT:  Head: Normocephalic and atraumatic.  Mouth/Throat: Oropharynx is clear and moist and mucous membranes are normal.  Eyes: Conjunctivae and EOM are normal. Right eye exhibits no discharge. Left eye exhibits no discharge.  Neck: Normal range of motion. Neck supple.  Cardiovascular: Normal rate, regular rhythm, normal heart sounds and intact distal pulses.  Exam reveals no gallop and no friction rub.   No murmur heard. Very mildly tachycardic likely from anxiety  Pulmonary/Chest: Effort normal and breath sounds normal. No respiratory distress. She has no decreased breath sounds. She has no wheezes. She has no rhonchi. She has no rales.  Abdominal: Soft. Normal appearance and bowel sounds are normal. She exhibits no distension. There is tenderness in the right upper quadrant, epigastric area and left  upper quadrant. There is no rigidity, no rebound, no guarding, no CVA tenderness, no tenderness at McBurney's point and negative Murphy's sign.  Soft, morbidly obese which limits exam but no obvious distension, +BS throughout, with mild upper abdomen TTP, no lower abd TTP, no r/g/r, neg murphy's, neg mcburney's, no CVA TTP   Musculoskeletal: Normal range of motion.  Neurological: She is alert and oriented to person, place, and time. She has normal strength. No sensory deficit.  Skin: Skin is warm, dry and intact. No rash noted.  Psychiatric: Her mood appears anxious.  Very anxious appearing  Nursing note and vitals reviewed.    ED Treatments / Results  Labs (all labs ordered are listed, but only abnormal results are displayed) Labs Reviewed  COMPREHENSIVE METABOLIC PANEL - Abnormal; Notable for the following:       Result Value   Chloride 100 (*)    CO2 21 (*)    Glucose, Bld 160 (*)    Total Protein 9.0 (*)    AST 50 (*)    ALT 59 (*)    All other components within normal limits  URINALYSIS, ROUTINE W REFLEX MICROSCOPIC (NOT AT [facility]) - Abnormal; Notable for the following:    APPearance CLOUDY (*)    Hgb urine  dipstick TRACE (*)    Nitrite POSITIVE (*)    Leukocytes, UA SMALL (*)    All other components within normal limits  URINE MICROSCOPIC-ADD ON - Abnormal; Notable for the following:    Squamous Epithelial / LPF 6-30 (*)    Bacteria, UA MANY (*)    All other components within normal limits  URINE CULTURE  LIPASE, BLOOD  CBC  I-STAT BETA HCG BLOOD, ED ([facility], [facility], [facility] ONLY)    EKG  EKG Interpretation None       Radiology Ct Abdomen Pelvis W Contrast  Result Date: 09/19/2015 CLINICAL DATA:  Generalized abdominal pain, nausea, constipation for 2 weeks. EXAM: CT ABDOMEN AND PELVIS WITH CONTRAST TECHNIQUE: Multidetector CT imaging of the abdomen and pelvis was performed using the standard protocol following bolus administration of intravenous contrast. CONTRAST:   ISOVUE-300 IOPAMIDOL (ISOVUE-300) INJECTION 61% COMPARISON:  None. FINDINGS: Lower chest: Heart is upper limits normal in size. Lingular scarring. No other focal airspace opacities or effusions. Hepatobiliary: Severe diffuse fatty infiltration of the liver. Liver is enlarged. No visible focal abnormality. Gallbladder grossly unremarkable. Pancreas: No focal abnormality or ductal dilatation. Spleen: No focal abnormality.  Normal size. Adrenals/Urinary Tract: No adrenal abnormality. No focal renal abnormality. No stones or hydronephrosis. Urinary bladder is unremarkable. Stomach/Bowel: Stomach, large and small bowel grossly unremarkable. Appendix is normal. Vascular/Lymphatic: No evidence of aneurysm or adenopathy. Reproductive: Uterus and adnexa unremarkable.  No mass. Other: No free fluid or free air. Musculoskeletal: No acute bony abnormality or focal bone lesion. IMPRESSION: Severe diffuse fatty infiltration of the liver with hepatomegaly. Electronically Signed   By: Charlett Nose M.D.   On: 09/19/2015 12:55    Procedures Procedures (including critical care time)  Medications Ordered in ED Medications  sodium chloride 0.9 % bolus 1,000 mL (0 mLs Intravenous Stopped 09/19/15 1154)  ondansetron (ZOFRAN) injection 4 mg (4 mg Intravenous Given 09/19/15 1028)  ketorolac (TORADOL) 30 MG/ML injection 30 mg (30 mg Intravenous Given 09/19/15 1401)  iopamidol (ISOVUE-300) 61 % injection (100 mLs  Contrast Given 09/19/15 1232)     Initial Impression / Assessment and Plan / ED Course  I have reviewed the triage vital signs and the nursing notes.  Pertinent labs & imaging results that were available during my care of the patient were reviewed by me and considered in my medical decision making (see chart for details).  Clinical Course    45 y.o. female here with constipation and abd pain x3 wks after eating pigs feet. Tried MgCitrate and had small loose stool yesterday but still feels like she needs to have a  BM. On exam, pt obese which limits exam, but no obvious distension, moderate tenderness in the upper abdomen without significant tenderness in the lower quadrants, neg murphy's and mcburney's, no CVA TTP. Appears anxious, mildly tachycardic likely from anxiety. BetaHCG neg. CBC WNL. CMP, lipase, and U/A pending. Will get CT abd/pelv to eval for obstruction vs other etiology. Doubt need for pelvic exam given lack of vaginal symptoms. Will give toradol, fluids, and zofran then recheck shortly.   10:38 AM CMP with minimal elevation in AST/ALT (?fatty liver? LFT elevations have been seen previously) and glucose 160 but otherwise WNL. Lipase WNL. U/A cloudy with trace hgb small leuks +nitrite 6-30 WBC and squamous so some contamination, many bacteria. Difficult to say whether this is truly a UTI vs nitrites from hgb discoloration skewing result, but given symptoms of abd pain, will send for culture and likely treat empirically  for UTI. Awaiting CT abd/pelv, then will reassess shortly.   1:52 PM CT with large hepatomegaly and fatty infiltration of liver, colon without large stool burden, no fecal impaction or stool burden in rectum, so doubt that rectal exam would be very beneficial since manual disimpaction doesn't seem necessary based on these images. Pt refused pain meds, feeling better overall and feels okay to go home. She will need PCP f/up for outpatient work up of the hepatomegaly, likely just fatty infiltration but will likely need outpatient U/S and further work up. Will send home with zofran for nausea, discussed use of miralax for stool softening although it really doesn't seem like she's constipated on imaging. Tylenol/motrin for pain. Keflex for UTI. Increase fiber/water intake. Stay hydrated. F/up with PCP in 5-7 days for recheck. Of note, HR improving after fluids. I explained the diagnosis and have given explicit precautions to return to the ER including for any other new or worsening symptoms. The  patient understands and accepts the medical plan as it's been dictated and I have answered their questions. Discharge instructions concerning home care and prescriptions have been given. The patient is STABLE and is discharged to home in good condition.  BP 127/86   Pulse 89   Temp 97.5 F (36.4 C) (Oral)   Resp 18   Ht 5\' 3"  (1.6 m)   Wt 136.1 kg   SpO2 96%   BMI 53.14 kg/m   Final Clinical Impressions(s) / ED Diagnoses   Final diagnoses:  Generalized abdominal pain  Constipation, unspecified constipation type  UTI (lower urinary tract infection)  Hyperglycemia, unspecified  Essential hypertension  Elevated LFTs  Fatty liver  Hepatomegaly  Nausea    New Prescriptions New Prescriptions   CEPHALEXIN (KEFLEX) 500 MG CAPSULE    Take 1 capsule (500 mg total) by mouth 2 (two) times daily. x5 days   ONDANSETRON (ZOFRAN ODT) 4 MG DISINTEGRATING TABLET    Take 1 tablet (4 mg total) by mouth every 8 (eight) hours as needed for nausea or vomiting.     France Ravens Camprubi-Soms, PA-C 09/19/15 1404    Laurence Spates, MD 09/20/15 1511

## 2015-09-19 NOTE — ED Notes (Signed)
Pt declines pain meds at present

## 2015-09-21 LAB — URINE CULTURE: Culture: >=100000 — AB

## 2015-09-22 ENCOUNTER — Telehealth (HOSPITAL_COMMUNITY): Payer: Self-pay

## 2015-09-22 NOTE — Telephone Encounter (Signed)
Post ED Visit - Positive Culture Follow-up  Culture report reviewed by antimicrobial stewardship pharmacist:  []  Enzo BiNathan Batchelder, Pharm.D. []  Celedonio MiyamotoJeremy Frens, Pharm.D., BCPS []  Garvin FilaMike Maccia, Pharm.D. []  Georgina PillionElizabeth Martin, Pharm.D., BCPS []  AlvordMinh Pham, [place]., BCPS, AAHIVP []  Estella HuskMichelle Turner, Pharm.D., BCPS, AAHIVP []  Tennis Mustassie Stewart, Pharm.D. []  Sherle Poeob Vincent, [place]. Gertie GowdaX  Emily Stewart, Pharm.D.  Positive urine culture, >/= 100,000 colonies -> E Coli Treated with Cephalexin, organism sensitive to the same and no further patient follow-up is required at this time.  Arvid RightClark, Silvestre [place] Dorn 09/22/2015, 4:47 PM

## 2015-11-07 ENCOUNTER — Emergency Department (HOSPITAL_COMMUNITY): Payer: Medicaid Other

## 2015-11-07 ENCOUNTER — Emergency Department (HOSPITAL_COMMUNITY)
Admission: EM | Admit: 2015-11-07 | Discharge: 2015-11-07 | Disposition: A | Discharge status: Home / Self Care | Payer: Medicaid Other | Attending: Emergency Medicine | Admitting: Emergency Medicine

## 2015-11-07 ENCOUNTER — Encounter (HOSPITAL_COMMUNITY): Payer: Self-pay | Admitting: *Deleted

## 2015-11-07 DIAGNOSIS — N390 Urinary tract infection, site not specified: Secondary | ICD-10-CM | POA: Insufficient documentation

## 2015-11-07 DIAGNOSIS — F17200 Nicotine dependence, unspecified, uncomplicated: Secondary | ICD-10-CM | POA: Insufficient documentation

## 2015-11-07 DIAGNOSIS — Z7982 Long term (current) use of aspirin: Secondary | ICD-10-CM | POA: Insufficient documentation

## 2015-11-07 DIAGNOSIS — B3731 Acute candidiasis of vulva and vagina: Secondary | ICD-10-CM

## 2015-11-07 DIAGNOSIS — R109 Unspecified abdominal pain: Secondary | ICD-10-CM | POA: Diagnosis present

## 2015-11-07 DIAGNOSIS — I10 Essential (primary) hypertension: Secondary | ICD-10-CM | POA: Insufficient documentation

## 2015-11-07 DIAGNOSIS — K5900 Constipation, unspecified: Secondary | ICD-10-CM | POA: Insufficient documentation

## 2015-11-07 DIAGNOSIS — B373 Candidiasis of vulva and vagina: Secondary | ICD-10-CM

## 2015-11-07 DIAGNOSIS — Z79899 Other long term (current) drug therapy: Secondary | ICD-10-CM | POA: Diagnosis not present

## 2015-11-07 DIAGNOSIS — B379 Candidiasis, unspecified: Secondary | ICD-10-CM | POA: Diagnosis not present

## 2015-11-07 LAB — LIPASE, BLOOD: LIPASE: 25 U/L (ref 11–51)

## 2015-11-07 LAB — URINE MICROSCOPIC-ADD ON

## 2015-11-07 LAB — CBC
HCT: 39.4 % (ref 36.0–46.0)
Hemoglobin: 12.8 g/dL (ref 12.0–15.0)
MCH: 28.6 pg (ref 26.0–34.0)
MCHC: 32.5 g/dL (ref 30.0–36.0)
MCV: 88.1 fL (ref 78.0–100.0)
PLATELETS: 359 10*3/uL (ref 150–400)
RBC: 4.47 MIL/uL (ref 3.87–5.11)
RDW: 14.7 % (ref 11.5–15.5)
WBC: 9.9 10*3/uL (ref 4.0–10.5)

## 2015-11-07 LAB — COMPREHENSIVE METABOLIC PANEL
ALT: 102 U/L — ABNORMAL HIGH (ref 14–54)
AST: 67 U/L — AB (ref 15–41)
Albumin: 3.7 g/dL (ref 3.5–5.0)
Alkaline Phosphatase: 131 U/L — ABNORMAL HIGH (ref 38–126)
Anion gap: 11 (ref 5–15)
BUN: 10 mg/dL (ref 6–20)
CHLORIDE: 98 mmol/L — AB (ref 101–111)
CO2: 25 mmol/L (ref 22–32)
CREATININE: 0.75 mg/dL (ref 0.44–1.00)
Calcium: 10 mg/dL (ref 8.9–10.3)
GFR calc Af Amer: >60 mL/min (ref >60)
Glucose, Bld: 172 mg/dL — ABNORMAL HIGH (ref 65–99)
Potassium: 4.1 mmol/L (ref 3.5–5.1)
Sodium: 134 mmol/L — ABNORMAL LOW (ref 135–145)
Total Bilirubin: 0.8 mg/dL (ref 0.3–1.2)
Total Protein: 9 g/dL — ABNORMAL HIGH (ref 6.5–8.1)

## 2015-11-07 LAB — WET PREP, GENITAL
Clue Cells Wet Prep HPF POC: NONE SEEN
SPERM: NONE SEEN
Trich, Wet Prep: NONE SEEN

## 2015-11-07 LAB — URINALYSIS, ROUTINE W REFLEX MICROSCOPIC
BILIRUBIN URINE: NEGATIVE
GLUCOSE, UA: NEGATIVE mg/dL
HGB URINE DIPSTICK: NEGATIVE
KETONES UR: NEGATIVE mg/dL
Nitrite: NEGATIVE
PROTEIN: NEGATIVE mg/dL
Specific Gravity, Urine: 1.023 (ref 1.005–1.030)
pH: 6 (ref 5.0–8.0)

## 2015-11-07 LAB — I-STAT BETA HCG BLOOD, ED (MC, WL, AP ONLY): I-stat hCG, quantitative: <5 m[IU]/mL (ref <5)

## 2015-11-07 MED ORDER — SODIUM CHLORIDE 0.9 % IV BOLUS (SEPSIS)
1000.0000 mL | Freq: Once | INTRAVENOUS | Status: AC
  Administered 2015-11-07: 1000 mL via INTRAVENOUS

## 2015-11-07 MED ORDER — CEPHALEXIN 500 MG PO CAPS: 500.0000 mg | ORAL_CAPSULE | Freq: Two times a day (BID) | ORAL | 0 refills | Status: DC

## 2015-11-07 MED ORDER — FLUCONAZOLE 100 MG PO TABS
150.0000 mg | ORAL_TABLET | Freq: Once | ORAL | Status: AC
  Administered 2015-11-07: 150 mg via ORAL
  Filled 2015-11-07: qty 2

## 2015-11-07 MED ORDER — MAGNESIUM CITRATE PO SOLN: 296.0000 mL | Freq: Once | ORAL | 0 refills | Status: AC

## 2015-11-07 MED ORDER — CEPHALEXIN 250 MG PO CAPS
500.0000 mg | ORAL_CAPSULE | Freq: Once | ORAL | Status: AC
  Administered 2015-11-07: 500 mg via ORAL
  Filled 2015-11-07: qty 2

## 2015-11-07 MED ORDER — ACETAMINOPHEN 325 MG PO TABS
650.0000 mg | ORAL_TABLET | Freq: Once | ORAL | Status: AC
  Administered 2015-11-07: 650 mg via ORAL
  Filled 2015-11-07: qty 2

## 2015-11-07 MED ORDER — POLYETHYLENE GLYCOL 3350 17 GM/SCOOP PO POWD: 17.0000 g | Freq: Two times a day (BID) | ORAL | 0 refills | Status: DC

## 2015-11-07 NOTE — ED Provider Notes (Signed)
[place] DEPT Provider Note   CSN: [id number] Arrival date & time: 11/07/15  4315     History   Chief Complaint Chief Complaint  Patient presents with  . Abdominal Pain    HPI [name] is a 45 y.o. female.  Patient is a 45 year old female with history of hypertension and constipation who presents the ED with complaints of abdominal pain and vaginal discharge. Patient reports she has had intermittent cramping pain to her mid abdomen for the past 4 days. Denies any aggravating or alleviating factors. She notes she has not had a bowel movement in the past 4 days. Denies taking any over-the-counter medications for her constipation. She also states she has had vaginal discharge for the past 4 days. Denies fever, chills, chest pain, shortness of breath, nausea, vomiting, urinary symptoms, vaginal bleeding, rash. Patient denies being sexually active. She notes her last menstrual period was over a year ago but reports history of irregular menses status post tubal ligation and notes she typically only has a period every 1-2 years. Endorses abdominal surgical history of tubal ligation and C-section.      Past Medical History:  Diagnosis Date  . Depression   . Hypertension     There are no active problems to display for this patient.   Past Surgical History:  Procedure Laterality Date  . CESAREAN SECTION      OB History    No data available       Home Medications    Prior to Admission medications   Medication Sig Start Date End Date Taking? Authorizing Provider  Aspirin-Salicylamide-Caffeine (BC HEADACHE POWDER PO) Take 1 packet by mouth as needed (for pain).   Yes Historical Provider, MD  risperiDONE (RISPERDAL) 1 MG tablet Take 1 mg by mouth at bedtime. 01/22/15  Yes Historical Provider, MD  sertraline (ZOLOFT) 100 MG tablet Take 200 mg by mouth daily with breakfast. 12/24/14  Yes Historical Provider, MD  cephALEXin (KEFLEX) 500 MG capsule Take 1 capsule (500  mg total) by mouth 2 (two) times daily. 11/07/15   Nona Dell, PA-C  docusate sodium (COLACE) 100 MG capsule Take 1 capsule (100 mg total) by mouth daily. Patient not taking: Reported on 11/07/2015 02/18/15   Sharlett Iles, MD  magnesium citrate solution Take 296 mLs by mouth once. OTC 11/07/15 11/07/15  Nona Dell, PA-C  metroNIDAZOLE (FLAGYL) 500 MG tablet Take 1 tablet (500 mg total) by mouth 2 (two) times daily. Patient not taking: Reported on 11/07/2015 02/18/15   Sharlett Iles, MD  ondansetron (ZOFRAN ODT) 4 MG disintegrating tablet Take 1 tablet (4 mg total) by mouth every 8 (eight) hours as needed for nausea or vomiting. Patient not taking: Reported on 11/07/2015 09/19/15   Mercedes Camprubi-Soms, PA-C  polyethylene glycol powder (GLYCOLAX/MIRALAX) powder Take 17 g by mouth 2 (two) times daily. Until daily soft stools  OTC 11/07/15   Nona Dell, PA-C    Family History No family history on file.  Social History Social History  Substance Use Topics  . Smoking status: Current Every Day Smoker    Packs/day: 0.50  . Smokeless tobacco: Never Used  . Alcohol use No     Allergies   Review of patient's allergies indicates no known allergies.   Review of Systems Review of Systems  Gastrointestinal: Positive for abdominal pain and constipation.  Genitourinary: Positive for vaginal discharge.  All other systems reviewed and are negative.    Physical Exam Updated Vital Signs BP  105/70   Pulse 79   Temp 98 F (36.7 C) (Oral)   Resp 18   Ht '5\' 2"'$  (1.575 m)   Wt 136.1 kg   LMP 10/08/2015   SpO2 97%   BMI 54.87 kg/m   Physical Exam  Constitutional: She is oriented to person, place, and time. She appears well-developed and well-nourished. No distress.  Morbidly obese female.   HENT:  Head: Normocephalic and atraumatic.  Mouth/Throat: Oropharynx is clear and moist. No oropharyngeal exudate.  Eyes: Conjunctivae and EOM  are normal. Right eye exhibits no discharge. Left eye exhibits no discharge. No scleral icterus.  Neck: Normal range of motion. Neck supple.  Cardiovascular: Regular rhythm, normal heart sounds and intact distal pulses.   Tachycardic, HR 110  Pulmonary/Chest: Effort normal and breath sounds normal. No respiratory distress. She has no wheezes. She has no rales. She exhibits no tenderness.  Abdominal: Soft. Bowel sounds are normal. She exhibits no distension and no mass. There is tenderness (mild TTP over periumbilical region). There is no rebound and no guarding. No hernia.  Protuberant abdomen. No CVA tenderness.  Musculoskeletal: She exhibits no edema.  Neurological: She is alert and oriented to person, place, and time.  Skin: Skin is warm and dry. She is not diaphoretic.  Nursing note and vitals reviewed.  Pelvic exam: normal external genitalia, vulva, vagina, cervix, uterus and adnexa, VULVA: normal appearing vulva with no masses, tenderness or lesions, VAGINA: normal appearing vagina with normal color and discharge, no lesions, vaginal discharge - white, curd-like and malodorous, CERVIX: normal appearing cervix without discharge or lesions, WET MOUNT done - results: white blood cells, yeast, DNA probe for chlamydia and GC obtained, UTERUS: uterus is normal size, shape, consistency and nontender, ADNEXA: normal adnexa in size, nontender and no masses, exam chaperoned by female tech.   ED Treatments / Results  Labs (all labs ordered are listed, but only abnormal results are displayed) Labs Reviewed  WET PREP, GENITAL - Abnormal; Notable for the following:       Result Value   Yeast Wet Prep HPF POC PRESENT (*)    WBC, Wet Prep HPF POC MANY (*)    All other components within normal limits  COMPREHENSIVE METABOLIC PANEL - Abnormal; Notable for the following:    Sodium 134 (*)    Chloride 98 (*)    Glucose, Bld 172 (*)    Total Protein 9.0 (*)    AST 67 (*)    ALT 102 (*)    Alkaline  Phosphatase 131 (*)    All other components within normal limits  URINALYSIS, ROUTINE W REFLEX MICROSCOPIC (NOT AT [facility]) - Abnormal; Notable for the following:    APPearance CLOUDY (*)    Leukocytes, UA LARGE (*)    All other components within normal limits  URINE MICROSCOPIC-ADD ON - Abnormal; Notable for the following:    Squamous Epithelial / LPF 6-30 (*)    Bacteria, UA MANY (*)    All other components within normal limits  LIPASE, BLOOD  CBC  RPR  HIV ANTIBODY (ROUTINE TESTING)  I-STAT BETA HCG BLOOD, ED ([facility], [facility], [facility] ONLY)  GC/CHLAMYDIA PROBE AMP ([address]) NOT AT [facility]    EKG  EKG Interpretation None       Radiology Dg Abd 2 Views  Result Date: 11/07/2015 CLINICAL DATA:  Upper abdominal and back pain for 4 days. EXAM: ABDOMEN - 2 VIEW COMPARISON:  CT scan 09/19/2015 FINDINGS: The bowel gas pattern is unremarkable except for  moderate stool throughout the colon. No findings for small bowel obstruction or free air. The soft tissue shadows are grossly maintained. The bony structures are intact. The lung bases are grossly clear. IMPRESSION: Moderate stool throughout the colon but no findings for small bowel obstruction or free air. Electronically Signed   By: Marijo Sanes M.D.   On: 11/07/2015 11:55    Procedures Procedures (including critical care time)  Medications Ordered in ED Medications  sodium chloride 0.9 % bolus 1,000 mL (0 mLs Intravenous Stopped 11/07/15 1334)  fluconazole (DIFLUCAN) tablet 150 mg (150 mg Oral Given 11/07/15 1434)  cephALEXin (KEFLEX) capsule 500 mg (500 mg Oral Given 11/07/15 1434)  acetaminophen (TYLENOL) tablet 650 mg (650 mg Oral Given 11/07/15 1443)     Initial Impression / Assessment and Plan / ED Course  I have reviewed the triage vital signs and the nursing notes.  Pertinent labs & imaging results that were available during my care of the patient were reviewed by me and considered in my medical decision making (see chart for  details).  Clinical Course    Patient presents with constipation for the past 4 days with associated mild abdominal pain and vaginal discharge. Denies taking any over-the-counter medications for constipation. Denies fever. Initial vitals revealed patient mildly tachycardic, heart rate 110, remaining vital stable. Chart review shows patient with mildly elevated heart rate at baseline. Exam revealed mild tenderness over periumbilical region, abdominal exam limited due to patient's body habitus. Pelvic exam revealed white curd-like d/c in vaginal vault, no CMT or adnexal tenderness. Patient given IV fluids. Pregnancy negative. Wet prep positive for yeast. CMP showed mildly elevated LFTs (AST 67, ALT 102, Alk Phos 131) which appear to be consistent with patient's baseline values. Chart review shows patient with fatty liver seen on recent CT abdomen. UA consistent with UTI. Abdominal x-ray revealed moderate stool throughout without findings of small bowel obstruction or free air. Suspect patient's symptoms are likely related to constipation with associated yeast infection and UTI. I do not suspect impaction at this time. Patient given initial dose of Keflex and single dose of Diflucan in the ED. Discussed results and plan for discharge with patient. Plan to discharge patient home with MiraLAX, magnesium citrate, Keflex and symptomatic treatment including continued oral hydration. Patient given information to follow-up with gastroenterology regarding her mildly elevated LFTs for further management and evaluation. Discussed return precautions.  Final Clinical Impressions(s) / ED Diagnoses   Final diagnoses:  Constipation  Urinary tract infection without hematuria, site unspecified  Yeast infection of the vagina    New Prescriptions Discharge Medication List as of 11/07/2015  2:45 PM    START taking these medications   Details  magnesium citrate solution Take 296 mLs by mouth once. OTC, Starting Mon  11/07/2015, Print         [address] [place], [place] 11/07/15 [place], [place] 11/16/15 [phone]

## 2015-11-07 NOTE — ED Triage Notes (Signed)
Pt states peri-umbilical pain, vaginal discharge and itching x 4 days.

## 2015-11-07 NOTE — Discharge Instructions (Signed)
Take your medications as prescribed until completed. I recommend taking one bottle of magnesium citrate and Miralax twice daily until you have a bowel movement. Continue drinking moderate home to remain hydrated. I recommend following up with the gastroenterology clinic listed above to schedule a follow-up appointment for further evaluation regarding your elevated liver enzymes. Please return to the Emergency Department if symptoms worsen or new onset of fever, chest pain, difficult to breathing, abdominal pain, vomiting, unable to keep fluids down, constipation, rectal bleeding, vaginal bleeding.

## 2015-11-08 LAB — GC/CHLAMYDIA PROBE AMP (~~LOC~~) NOT AT ARMC
CHLAMYDIA, DNA PROBE: NEGATIVE
Neisseria Gonorrhea: NEGATIVE

## 2015-11-08 LAB — HIV ANTIBODY (ROUTINE TESTING W REFLEX): HIV SCREEN 4TH GENERATION: NONREACTIVE

## 2015-11-08 LAB — RPR: RPR: NONREACTIVE

## 2016-07-12 ENCOUNTER — Ambulatory Visit: Payer: Medicaid Other | Admitting: Gastroenterology

## 2016-08-30 ENCOUNTER — Ambulatory Visit: Payer: Medicaid Other | Admitting: Gastroenterology

## 2016-10-31 ENCOUNTER — Ambulatory Visit: Payer: Medicaid Other | Admitting: Gastroenterology

## 2016-12-05 ENCOUNTER — Other Ambulatory Visit: Payer: Self-pay

## 2016-12-05 NOTE — Progress Notes (Signed)
error 

## 2016-12-27 ENCOUNTER — Ambulatory Visit: Payer: Medicaid Other | Admitting: Gastroenterology

## 2016-12-28 ENCOUNTER — Telehealth: Payer: Self-pay

## 2016-12-28 NOTE — Telephone Encounter (Signed)
-----   Message from Benancio DeedsSteven P Armbruster, MD sent at 12/27/2016  5:15 PM EST ----- Regarding: RE: Cancelled 3x on day of appt, 4th on day before Thanks Jan, Can you let them know that if she no shows again, she will be charged a cancellation fee and will not be allowed to rebook with our office. If she wishes to receive care here she needs to make her appointment. Thanks  ----- Message ----- From: Cooper RenderHogan, Geraldina Parrott M, CMA Sent: 12/27/2016   4:11 PM To: Benancio DeedsSteven P Armbruster, MD Subject: Cancelled 3x on day of appt, 4th on day befo#  Dr. Mervyn SkeetersA, this pt cancelled their last 3 appts on the day of the appt.  The appt before that was cancelled on the day before their appt.  They have rescheduled for 02-18-17.  Would you like for me to contact them about scheduling further appts?  Jan

## 2016-12-28 NOTE — Telephone Encounter (Signed)
Called and spoke to pt. Let her know that if she cancels again we will not be able to reschedule her. She expressed understanding.  I made sure she was aware of the date and time of her appt on 02-18-17 at 9:30am.  She confirmed that she can make that date and time.

## 2017-02-18 ENCOUNTER — Ambulatory Visit: Payer: Medicaid Other | Admitting: Gastroenterology

## 2017-09-23 IMAGING — CT CT ABD-PELV W/ CM
2 of 5 series · 16 of 46 positions shown, 18 images · IV contrast (Omni 300)
Comparison: None.

CLINICAL DATA: Generalized abdominal pain, nausea, constipation for
2 weeks.

EXAM:
CT ABDOMEN AND PELVIS WITH CONTRAST
TECHNIQUE: Multidetector CT imaging of the abdomen and pelvis was performed
using the standard protocol following bolus administration of
intravenous contrast.
CONTRAST:  100mL U43N3L-1AA IOPAMIDOL (U43N3L-1AA) INJECTION 61%

[Series 2: a/p w/ 5mm · axial · 0.88mm/px · z∈[+924,+1338]mm · 13 of 93 slices shown, 15 images]
[im 5/93  soft-tissue]
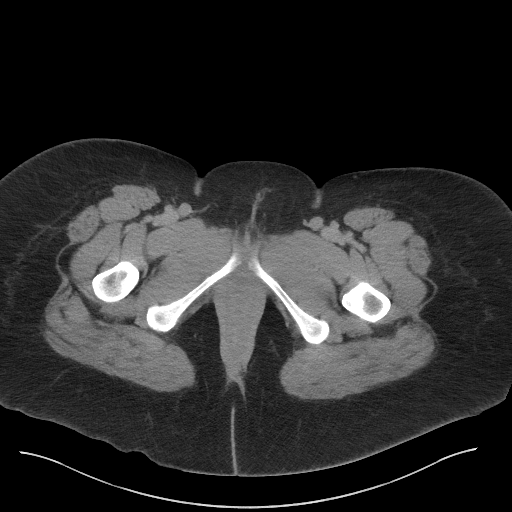
[im 5/93  bone]
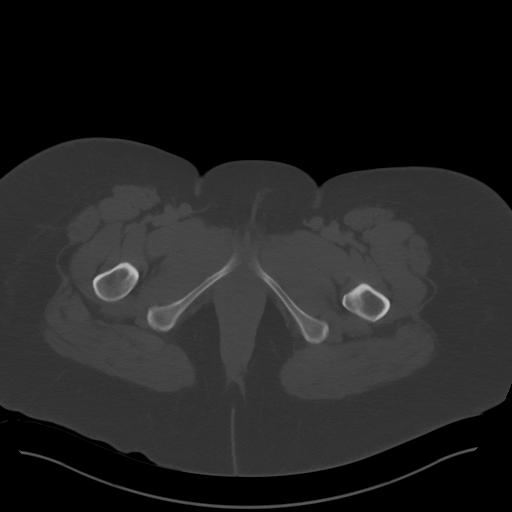
[im 15/93  soft-tissue]
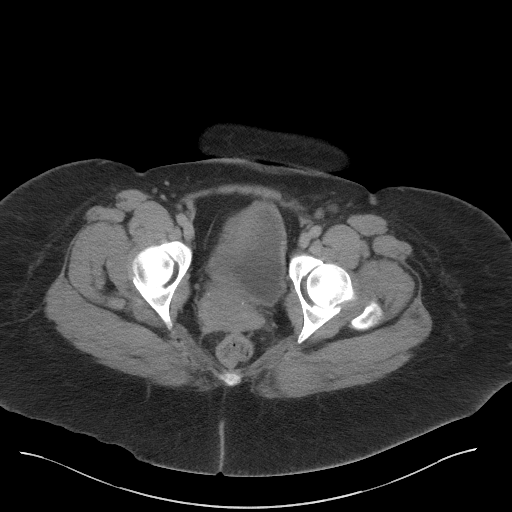
[im 20/93  soft-tissue]
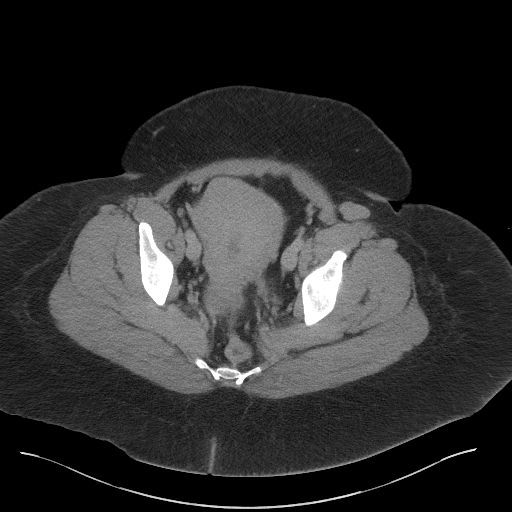
[im 25/93  soft-tissue]
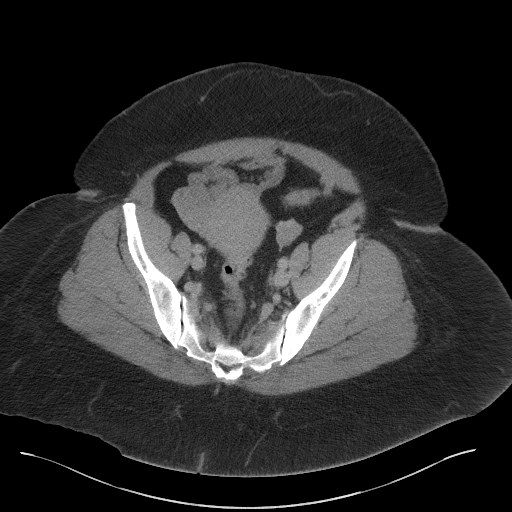
[im 34/93  soft-tissue]
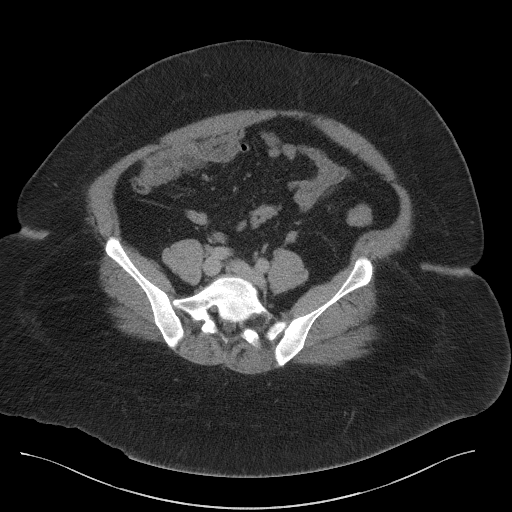
[im 39/93  soft-tissue]
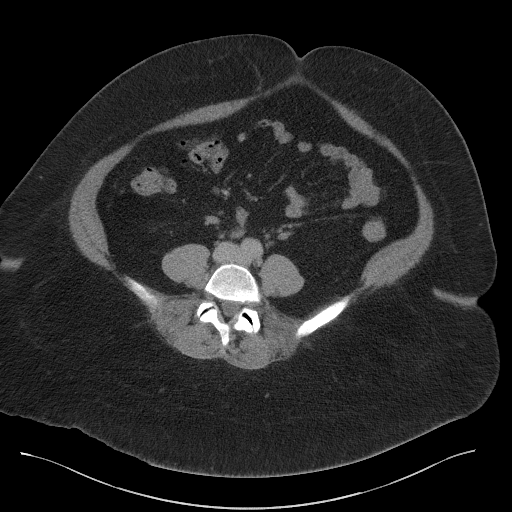
[im 49/93  soft-tissue]
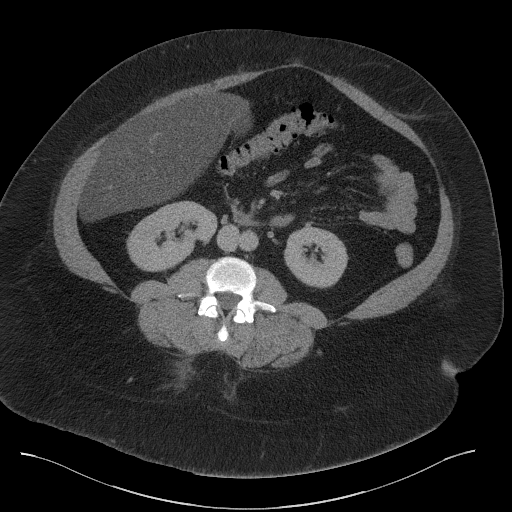
[im 54/93  soft-tissue]
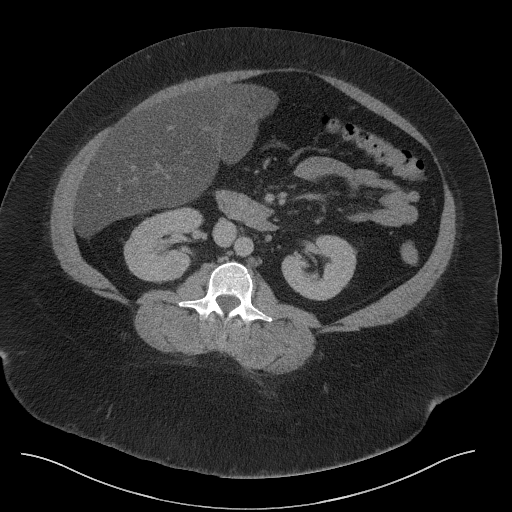
[im 59/93  soft-tissue]
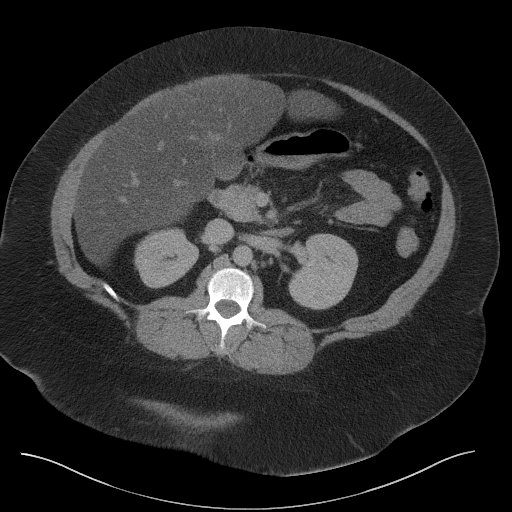
[im 59/93  bone]
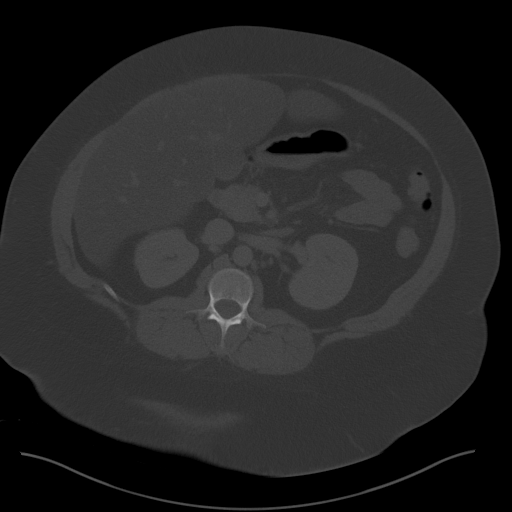
[im 68/93  soft-tissue]
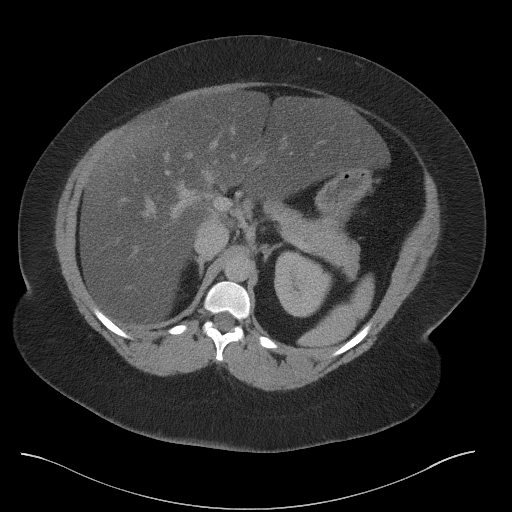
[im 73/93  soft-tissue]
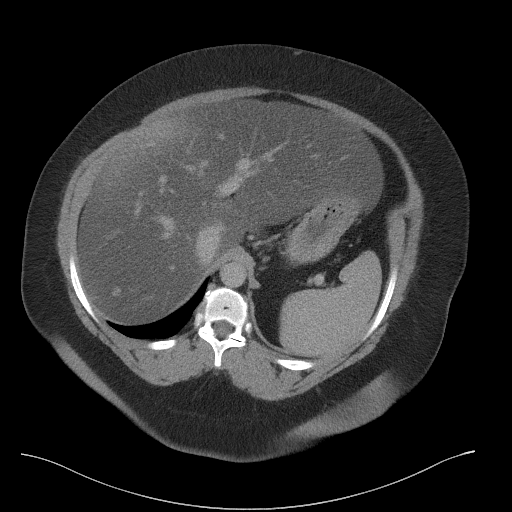
[im 78/93  soft-tissue]
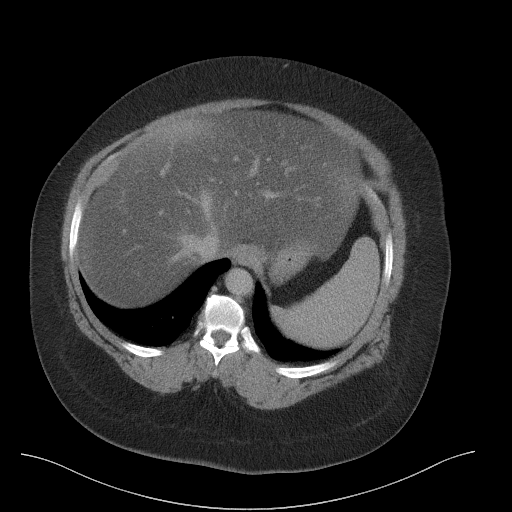
[im 88/93  soft-tissue]
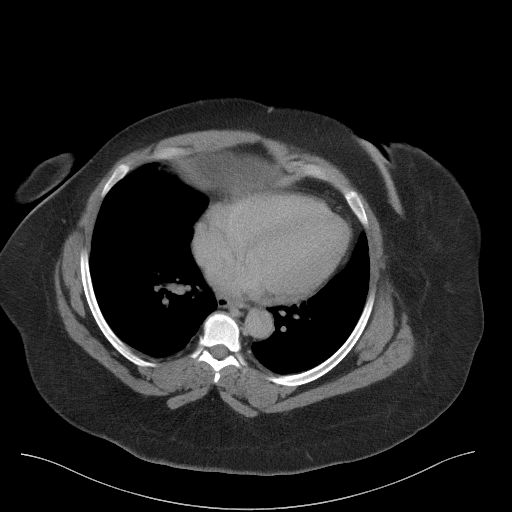

[Series 5: a/p w/ cor · coronal · 0.70mm/px · 3 of 151 slices shown]
[im 51/151  soft-tissue]
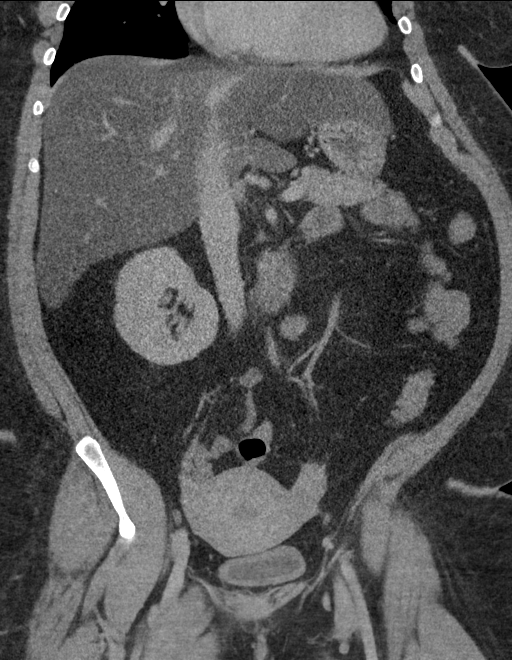
[im 67/151  soft-tissue]
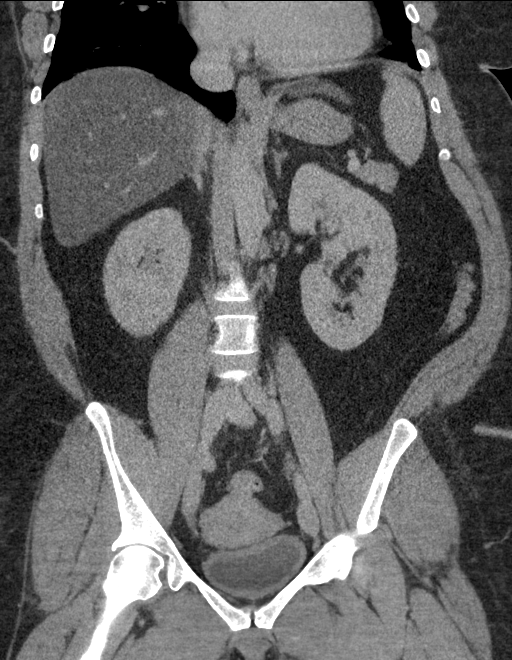
[im 84/151  soft-tissue]
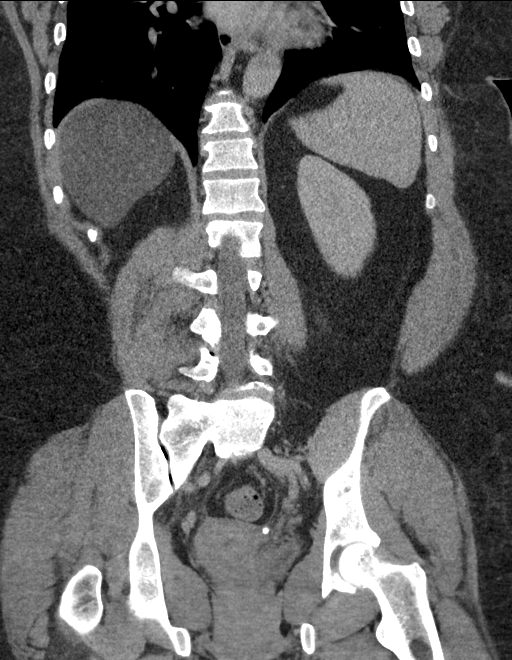

[16 of 46 positions shown; findings below may reference images not displayed]

FINDINGS: Lower chest: Heart is upper limits normal in size. Lingular
scarring. No other focal airspace opacities or effusions.

Hepatobiliary: Severe diffuse fatty infiltration of the liver. Liver
is enlarged. No visible focal abnormality. Gallbladder grossly
unremarkable.

Pancreas: No focal abnormality or ductal dilatation.

Spleen: No focal abnormality.  Normal size.

Adrenals/Urinary Tract: No adrenal abnormality. No focal renal
abnormality. No stones or hydronephrosis. Urinary bladder is
unremarkable.

Stomach/Bowel: Stomach, large and small bowel grossly unremarkable.
Appendix is normal.

Vascular/Lymphatic: No evidence of aneurysm or adenopathy.

Reproductive: Uterus and adnexa unremarkable.  No mass.

Other: No free fluid or free air.

Musculoskeletal: No acute bony abnormality or focal bone lesion.
IMPRESSION: Severe diffuse fatty infiltration of the liver with hepatomegaly.

## 2017-10-08 ENCOUNTER — Ambulatory Visit: Payer: Medicaid Other

## 2017-10-31 ENCOUNTER — Ambulatory Visit: Payer: Medicaid Other | Admitting: Physical Therapy

## 2017-11-11 ENCOUNTER — Ambulatory Visit: Payer: Medicaid Other | Attending: Orthopedic Surgery | Admitting: Physical Therapy

## 2017-11-11 ENCOUNTER — Encounter: Payer: Self-pay | Admitting: Physical Therapy

## 2017-11-11 ENCOUNTER — Other Ambulatory Visit: Payer: Self-pay

## 2017-11-11 DIAGNOSIS — G8929 Other chronic pain: Secondary | ICD-10-CM | POA: Insufficient documentation

## 2017-11-11 DIAGNOSIS — M6281 Muscle weakness (generalized): Secondary | ICD-10-CM | POA: Insufficient documentation

## 2017-11-11 DIAGNOSIS — M545 Low back pain: Secondary | ICD-10-CM | POA: Diagnosis not present

## 2017-11-11 IMAGING — DX DG ABDOMEN 2V
3 series · 3 of 3 positions shown · non-contrast
Comparison: CT scan 09/19/2015

CLINICAL DATA: Upper abdominal and back pain for 4 days.

EXAM:
ABDOMEN - 2 VIEW

[abdomen erect]
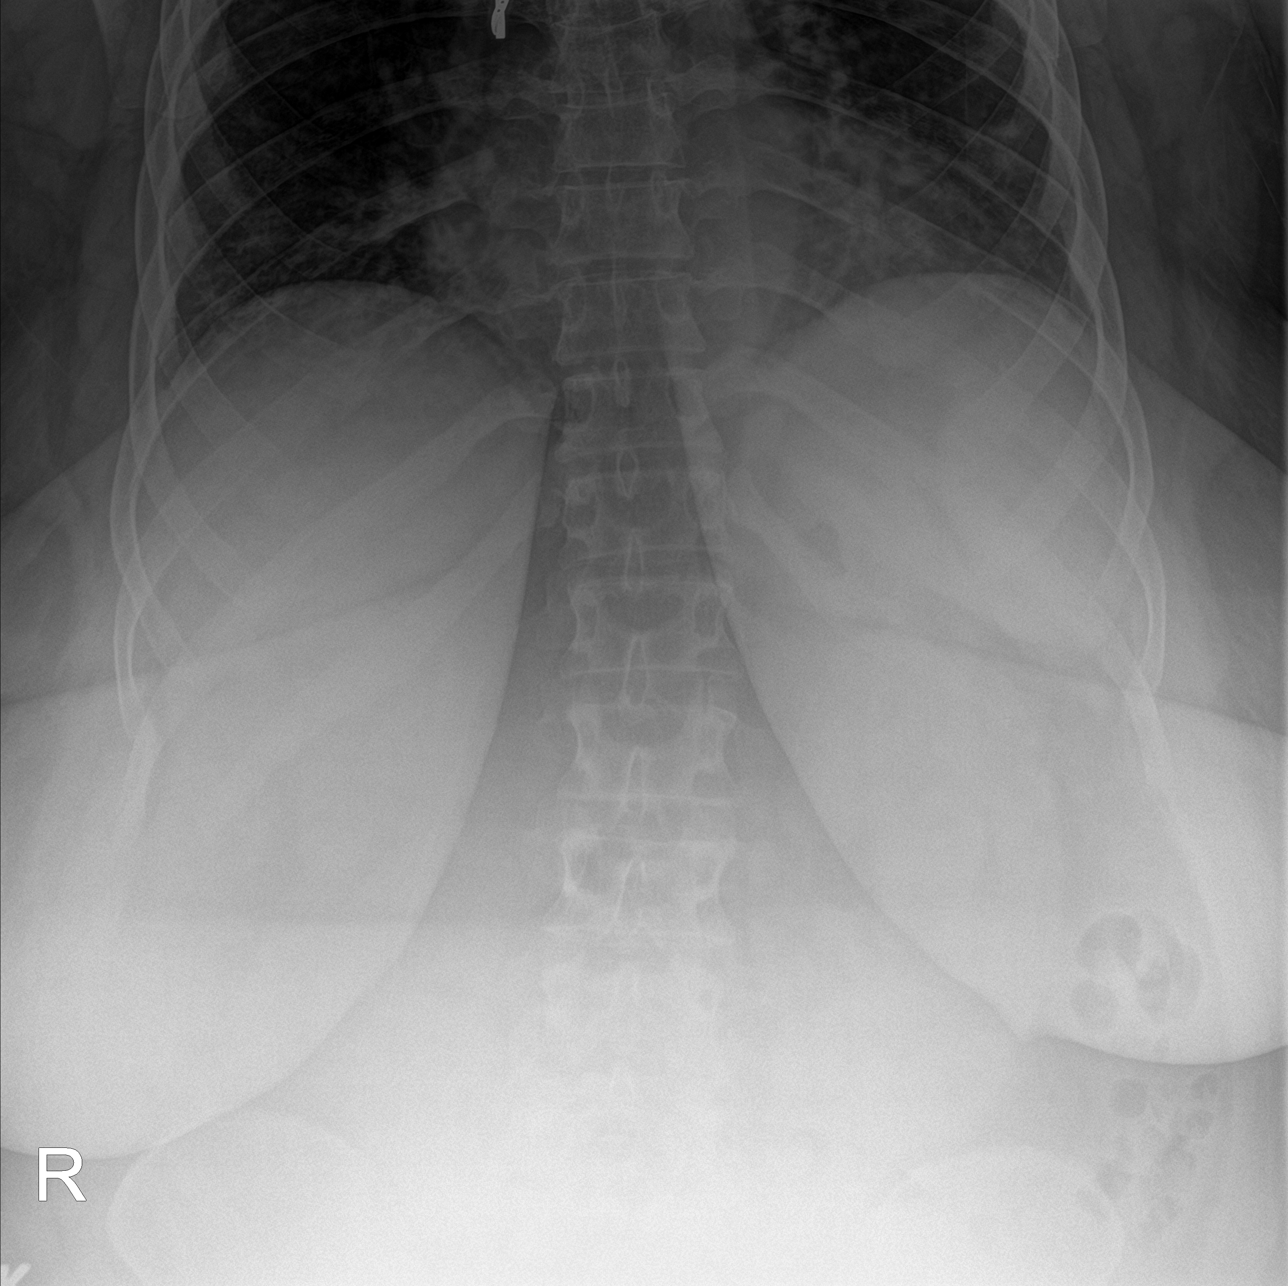

[abdomen supine (1 of 2)]
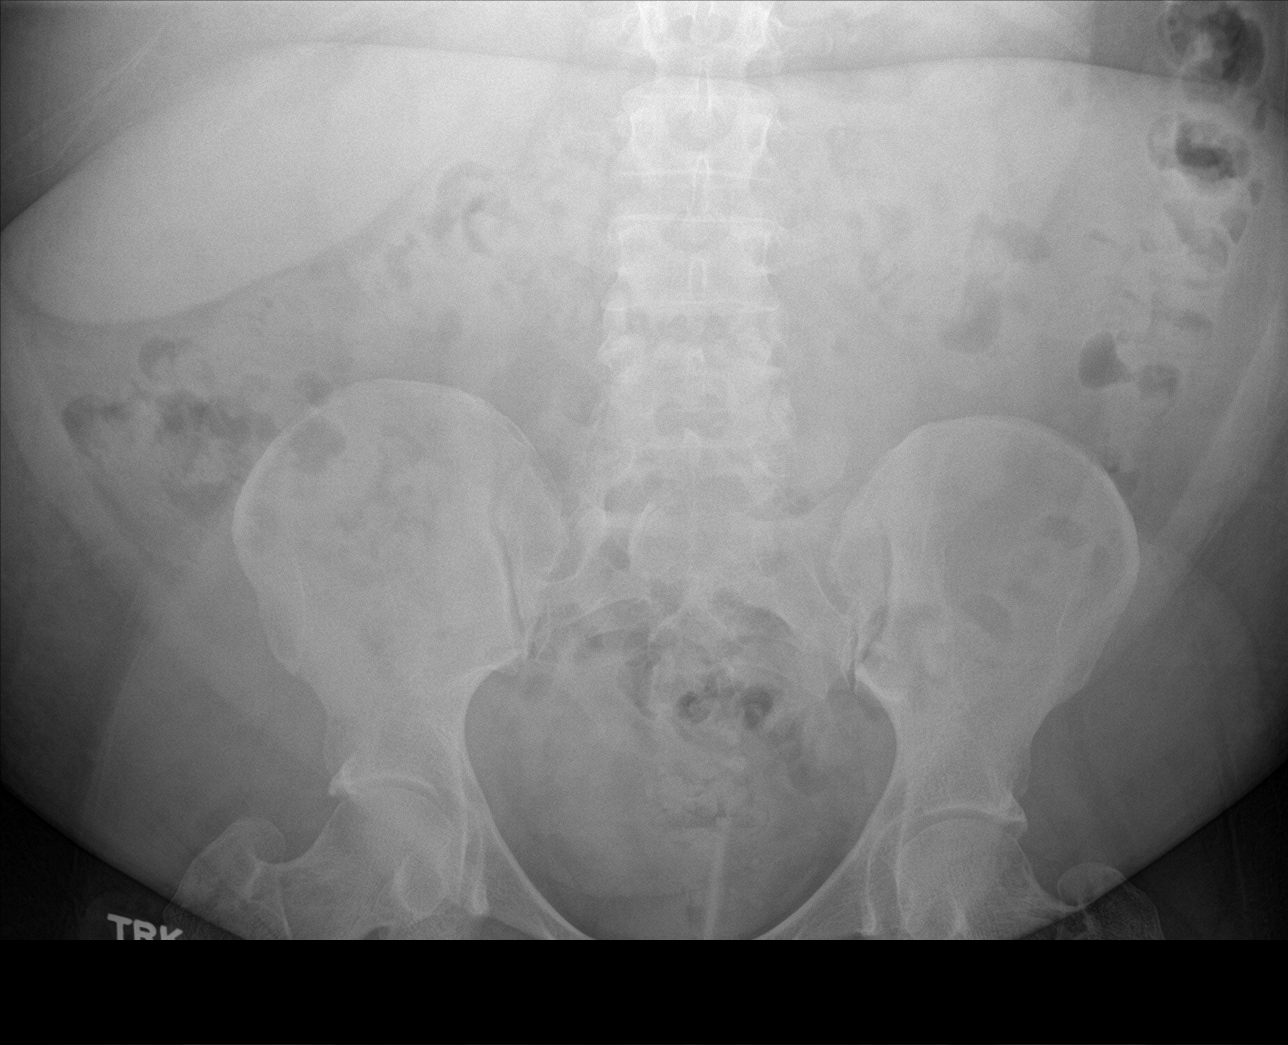

[abdomen supine (2 of 2)]
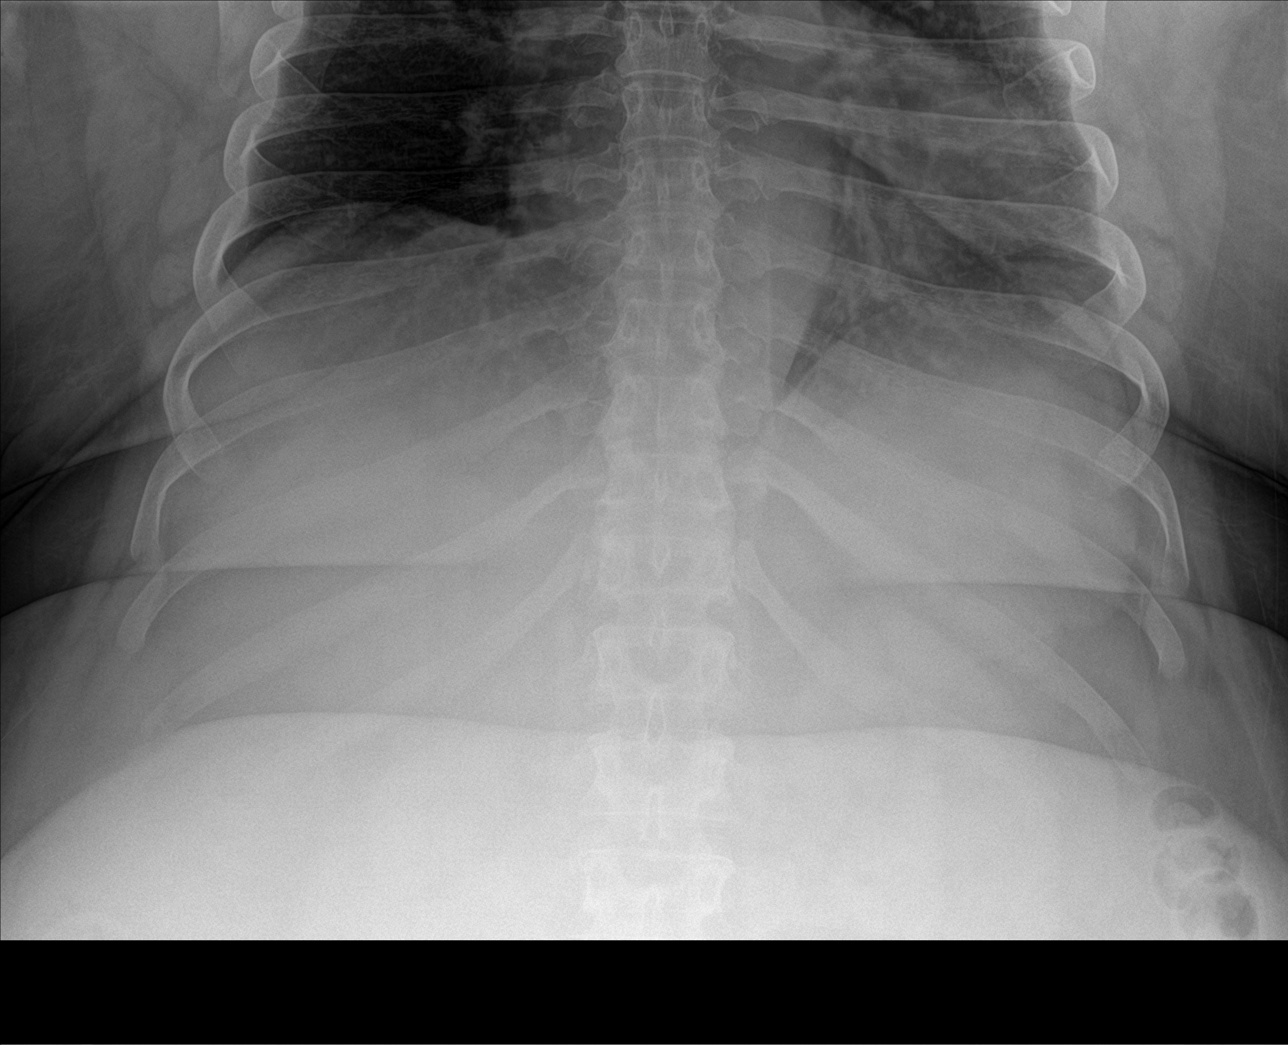

[3 of 3 positions shown; findings below may reference images not displayed]

FINDINGS: The bowel gas pattern is unremarkable except for moderate stool
throughout the colon. No findings for small bowel obstruction or
free air. The soft tissue shadows are grossly maintained. The bony
structures are intact. The lung bases are grossly clear.
IMPRESSION: Moderate stool throughout the colon but no findings for small bowel
obstruction or free air.

## 2017-11-11 NOTE — Therapy (Signed)
[facility] [address] [place], [place], [address] Phone: [phone]   Fax:  [phone]  Physical Therapy Evaluation  Patient Details  Name: [name] MRN: [id number] Date of Birth: 24-May-1970 Referring Provider (PT): Dr Lunette Stands   Encounter Date: 11/11/2017  PT End of Session - 11/11/17 0806    Visit Number  1    Number of Visits  4    Date for PT Re-Evaluation  12/02/17    PT Start Time  0806    PT Stop Time  0837    PT Time Calculation (min)  31 min    Activity Tolerance  Patient limited by fatigue       Past Medical History:  Diagnosis Date  . Constipation   . Depression   . History of hepatomegaly   . Hypertension     Past Surgical History:  Procedure Laterality Date  . CESAREAN SECTION      There were no vitals filed for this visit.   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  She has had increased pain over the last year.  Has tried exercising on treadmill without relief.    How long can you walk comfortably?  difficulty with household walking.     Diagnostic tests  x-ray - curve    Patient Stated Goals  walk better and straighten things out.     Currently in Pain?  No/denies   pain comes on with 5' of standing.  Sometimes wakes at night        [facility] PT Assessment - 11/11/17 0001      Assessment   Medical Diagnosis  LBP    Referring Provider (PT)  Dr Lunette Stands    Onset Date/Surgical Date  11/11/16    Hand Dominance  Right    Next MD Visit  12/22/17    Prior Therapy  none      Precautions   Precautions  None      Balance Screen   Has the patient fallen in the past 6 months  No      Home Environment   Living Environment  Private residence    Home Layout  One level      Prior Function   Level of Independence  Independent    Vocation  Unemployed    Leisure  cook - currently has to sit in a chair       Posture/Postural Control   Posture/Postural Control  Postural limitations    Postural Limitations  Increased lumbar lordosis   obesity     ROM / Strength   AROM / PROM / Strength  AROM;Strength      AROM   AROM Assessment Site  Lumbar;Hip    Lumbar Flexion  to top of shins with central LBP    Lumbar Extension  90% present with LBP    Lumbar - Right Side Bend  WNL    Lumbar - Left Side Bend  50% present with LBP    Lumbar - Right Rotation  WNL with LBP    Lumbar - Left Rotation  WNL with LBP      Strength   Strength Assessment Site  Hip;Knee;Ankle;Lumbar    Right/Left Hip  Right;Left    Right Hip Flexion  5/5    Right Hip Extension  4/5    Right Hip ABduction  4/5  Left Hip Flexion  5/5    Left Hip Extension  4/5    Left Hip ABduction  4+/5    Right/Left Knee  --   LT WNL, Rt grossly 4/5 with shaking   Right/Left Ankle  --   WNL   Lumbar Flexion  --   TA poor   Lumbar Extension  --   mulfidi poor     Flexibility   Soft Tissue Assessment /Muscle Length  --   limited lower body due to soft tissue approximation.      Palpation   Spinal mobility  painfull with lumbar CPA mobs    Palpation comment  tight and tender in bilat gluts and Rt lumbar paraspinals and QL                Objective measurements completed on examination: See above findings.      [facility] Adult PT Treatment/Exercise - 11/11/17 0001      Exercises   Exercises  Lumbar      Lumbar Exercises: Stretches   Single Knee to Chest Stretch  Left;Right;3 reps;10 seconds    Lower Trunk Rotation  3 reps;10 seconds      Lumbar Exercises: Seated   Sit to Stand  10 reps   with VC for TA engagement     Lumbar Exercises: Supine   Ab Set  10 reps;5 seconds   VC for form   Bridge  10 reps             PT Education - 11/11/17 0846    Education Details  HEP    Person(s) Educated  Patient;Child(ren)    Methods  Explanation;Demonstration;Handout    Comprehension  Returned  demonstration;Verbal cues required;Verbalized understanding       PT Short Term Goals - 11/11/17 0853      PT SHORT TERM GOAL #1   Title  I with initial HEP for core and walking program ( 12/02/17)     Baseline  not performing any exercise and unable to walk more than 5'    Time  3    Period  Weeks    Status  New    Target Date  12/02/17      PT SHORT TERM GOAL #2   Title  improve lumbar ROM to [facility] in all directions with minimal to no pain to allow her to perform lower body dressing with ease (12/02/17)     Baseline  limited lumbar ROM and pain in all directions    Time  3    Period  Weeks    Status  New    Target Date  12/02/17      PT SHORT TERM GOAL #3   Title  tolerate =/> 20' in standing while preparing a meal ( 12/02/17)     Baseline  currently sitting to prepare her meals    Time  3    Period  Weeks    Status  New    Target Date  12/02/17      PT SHORT TERM GOAL #4   Title  improve Rt hip strength =/> 5-/5 and demo good form with core work to help decrease pain ( 12/02/17)     Baseline  strength Rt hip grossly 4/5, core is poor    Time  3    Period  Weeks    Status  New    Target Date  12/02/17        PT Long Term Goals - 11/11/17  0857      PT LONG TERM GOAL #1   Title  to be set PRN after 3 wks.     Time  3    Period  Weeks    Status  New    Target Date  12/02/17             Plan - 11/11/17 0846    Clinical Impression Statement  47 yo female presents for assessment of LBP, daughter is with her.  She reports progressive worsening of her LBP to the point where she is limited to household ambulation and can only tolerate about 5' of standing before needing to sit and rest.  Pt states x-rays showed she has a curve in her back. She has weakness in her core and Rt hip along with tightness and tenderness in bilat gluts and muscles in her Rt low back.  She would benefit from PT to address these deficits and assist her to begin a walking program to help with  overall wellness.      History and Personal Factors relevant to plan of care:  obesity, HTN, depression    Clinical Presentation  Stable    Clinical Decision Making  Low    Rehab Potential  Good    PT Frequency  1x / week    PT Duration  4 weeks    PT Treatment/Interventions  Neuromuscular re-education;Dry needling;Joint Manipulations;Manual techniques;Moist Heat;Traction;Ultrasound;Therapeutic activities;Patient/family education;Therapeutic exercise;Cryotherapy;Electrical Stimulation    PT Next Visit Plan  assess tolerance to HEP, progress core and lower body, initiate formal walking program    Consulted and Agree with Plan of Care  Patient       Patient will benefit from skilled therapeutic intervention in order to improve the following deficits and impairments:  Pain, Postural dysfunction, Decreased strength, Decreased range of motion, Increased muscle spasms  Visit Diagnosis: Chronic bilateral low back pain without sciatica - Plan: PT plan of care cert/re-cert  Muscle weakness (generalized) - Plan: PT plan of care cert/re-cert     Problem List There are no active problems to display for this patient.   Roderic Scarce PT  11/11/2017, 8:58 AM  [facility] [address] [place], [place], [address] Phone: [phone]   Fax:  [phone]  Name: [name] MRN: [id number] Date of Birth: [birth year]

## 2017-11-19 ENCOUNTER — Encounter (HOSPITAL_COMMUNITY): Payer: Self-pay

## 2017-11-19 ENCOUNTER — Ambulatory Visit (HOSPITAL_COMMUNITY)
Admission: EM | Admit: 2017-11-19 | Discharge: 2017-11-19 | Disposition: A | Discharge status: Home / Self Care | Payer: Medicaid Other | Attending: Family Medicine | Admitting: Family Medicine

## 2017-11-19 DIAGNOSIS — I10 Essential (primary) hypertension: Secondary | ICD-10-CM

## 2017-11-19 NOTE — ED Triage Notes (Signed)
Pt presents with elevated blood pressure; pt states having no symptoms.

## 2017-11-19 NOTE — ED Provider Notes (Signed)
[facility]    CSN: [id number] Arrival date & time: 11/19/17  0849     History   Chief Complaint Chief Complaint  Patient presents with  . Hypertension    HPI [name] is a 47 y.o. female history of hypertension, tobacco use, hyperlipidemia presenting today for evaluation of elevated blood pressure.  Patient states that she has noticed her blood pressure elevated recently.  States that it was measured at [facility] yesterday and was 179/111.  She came today to have this evaluated.  She states that she has felt relatively normal.  She has had a slight headache.  Denies vision changes.  Denies chest pain.  Does note that she has shortness of breath with walking short distances, but this is normal for her and has not worsened recently.  Patient smokes approximately 4 cigarettes/day.  Patient has a primary care, recently started on blood pressure medicine approximately 2 to 3 months ago, she is unsure of what she is taking, believes it started with an "M".  Has follow-up in December.  HPI  Past Medical History:  Diagnosis Date  . Constipation   . Depression   . History of hepatomegaly   . Hypertension     There are no active problems to display for this patient.   Past Surgical History:  Procedure Laterality Date  . CESAREAN SECTION      OB History   None      Home Medications    Prior to Admission medications   Medication Sig Start Date End Date Taking? Authorizing Provider  Aspirin-Salicylamide-Caffeine (BC HEADACHE POWDER PO) Take 1 packet by mouth as needed (for pain).    [provider]  atorvastatin (LIPITOR) 20 MG tablet Take 20 mg by mouth daily. 09/04/16   [provider]  docusate sodium (COLACE) 100 MG capsule Take 1 capsule (100 mg total) by mouth daily. 02/18/15   Little, Ambrose Finland, MD  ondansetron (ZOFRAN ODT) 4 MG disintegrating tablet Take 1 tablet (4 mg total) by mouth every 8 (eight) hours as needed for nausea or  vomiting. 09/19/15   Street, [place], PA-C  polyethylene glycol powder (GLYCOLAX/MIRALAX) powder Take 17 g by mouth 2 (two) times daily. Until daily soft stools  OTC 11/07/15   Barrett Henle, PA-C  risperiDONE (RISPERDAL) 1 MG tablet Take 1 mg by mouth at bedtime. 01/22/15   [provider]  sertraline (ZOLOFT) 100 MG tablet Take 200 mg by mouth daily with breakfast. 12/24/14   [provider]    Family History History reviewed. No pertinent family history.  Social History Social History   Tobacco Use  . Smoking status: Current Every Day Smoker    Packs/day: 0.50  . Smokeless tobacco: Never Used  Substance Use Topics  . Alcohol use: No  . Drug use: No     Allergies   Patient has no known allergies.   Review of Systems Review of Systems  Constitutional: Negative for fatigue and fever.  HENT: Negative for congestion, sinus pressure and sore throat.   Eyes: Negative for photophobia, pain and visual disturbance.  Respiratory: Positive for shortness of breath. Negative for cough.   Cardiovascular: Negative for chest pain.  Gastrointestinal: Negative for abdominal pain, nausea and vomiting.  Genitourinary: Negative for decreased urine volume and hematuria.  Musculoskeletal: Negative for myalgias, neck pain and neck stiffness.  Neurological: Positive for headaches. Negative for dizziness, syncope, facial asymmetry, speech difficulty, weakness, light-headedness and numbness.     Physical Exam  Triage Vital Signs ED Triage Vitals  Enc Vitals Group     BP 11/19/17 0910 (!) 167/116     Pulse Rate 11/19/17 0910 (!) 105     Resp 11/19/17 0910 (!) 22     Temp 11/19/17 0910 97.7 F (36.5 C)     Temp Source 11/19/17 0910 Oral     SpO2 11/19/17 0910 94 %     Weight --      Height --      Head Circumference --      Peak Flow --      Pain Score 11/19/17 0920 0     Pain Loc --      Pain Edu? --      Excl. in GC? --    No data found.  Updated  Vital Signs BP (!) 167/116 (BP Location: Left Arm)   Pulse (!) 105   Temp 97.7 F (36.5 C) (Oral)   Resp (!) 22   LMP 10/08/2015   SpO2 94%    Blood pressure rechecked with feet on ground in back supported and arm at heart level, clothing removed, right arm measuring 133/83, left arm 141/100.  Visual Acuity Right Eye Distance:   Left Eye Distance:   Bilateral Distance:    Right Eye Near:   Left Eye Near:    Bilateral Near:     Physical Exam  Constitutional: She is oriented to person, place, and time. She appears well-developed and well-nourished. No distress.  Obese  HENT:  Head: Normocephalic and atraumatic.  Mouth/Throat: Oropharynx is clear and moist.  Eyes: Pupils are equal, round, and reactive to light. Conjunctivae and EOM are normal.  Neck: Neck supple.  Cardiovascular: Normal rate and regular rhythm.  No murmur heard. Pulmonary/Chest: Effort normal and breath sounds normal. No respiratory distress.  Breathing comfortably at rest, CTABL, no wheezing, rales or other adventitious sounds auscultated  Abdominal: Soft. There is no tenderness.  Musculoskeletal: She exhibits no edema.  Neurological: She is alert and oriented to person, place, and time. She displays normal reflexes. No cranial nerve deficit. Coordination normal.  Skin: Skin is warm and dry.  Psychiatric: She has a normal mood and affect.  Nursing note and vitals reviewed.    UC Treatments / Results  Labs (all labs ordered are listed, but only abnormal results are displayed) Labs Reviewed - No data to display  EKG None  Radiology No results found.  Procedures Procedures (including critical care time)  Medications Ordered in UC Medications - No data to display  Initial Impression / Assessment and Plan / UC Course  I have reviewed the triage vital signs and the nursing notes.  Pertinent labs & imaging results that were available during my care of the patient were reviewed by me and considered  in my medical decision making (see chart for details).      Given rechecking blood pressure, unclear which medicine she is currently on, no symptoms or neuro deficits.  Will defer alterations in blood pressure medications at this time.  Recommended patient to continue to monitor her blood pressure at home or at pharmacies, record readings, follow-up with primary care sooner than December for further evaluation and management.  Discussed working on lifestyle modifications as well.  Follow-up here in emergency room if symptoms worsening, blood pressure significantly elevated.Discussed strict return precautions. Patient verbalized understanding and is agreeable with plan.  Final Clinical Impressions(s) / UC Diagnoses   Final diagnoses:  Essential hypertension     Discharge  Instructions     Please follow-up with your primary care for further management of your blood pressure Your blood pressure was elevated today in clinic. Please be sure to take blood pressure medications as prescribed. Please monitor your blood pressure at home or when you go to a [facility]. Please follow up with your primary care doctor to recheck blood pressure and discuss any need for medication changes.   Please work on lifestyle modifications including healthy diet and increased exercise  Please go to Emergency Room if you start to experience severe headache, vision changes, decreased urine production, chest pain, shortness of breath, speech slurring, one sided weakness.   ED Prescriptions    None     Controlled Substance Prescriptions [place] Controlled Substance Registry consulted? Not Applicable   Lew Dawes, [place] 11/19/17 1010

## 2017-11-19 NOTE — Discharge Instructions (Signed)
Please follow-up with your primary care for further management of your blood pressure Your blood pressure was elevated today in clinic. Please be sure to take blood pressure medications as prescribed. Please monitor your blood pressure at home or when you go to a [facility]. Please follow up with your primary care doctor to recheck blood pressure and discuss any need for medication changes.   Please work on lifestyle modifications including healthy diet and increased exercise  Please go to Emergency Room if you start to experience severe headache, vision changes, decreased urine production, chest pain, shortness of breath, speech slurring, one sided weakness.

## 2017-11-20 ENCOUNTER — Ambulatory Visit: Payer: Medicaid Other | Admitting: Physical Therapy

## 2017-11-27 ENCOUNTER — Ambulatory Visit: Payer: Medicaid Other | Admitting: Physical Therapy

## 2017-12-04 ENCOUNTER — Telehealth: Payer: Self-pay | Admitting: Physical Therapy

## 2017-12-04 ENCOUNTER — Ambulatory Visit: Payer: Medicaid Other | Admitting: Physical Therapy

## 2017-12-04 NOTE — Telephone Encounter (Signed)
Called patient regarding missed appointment today.  She reports she is sick with the flu and will call to reschedule once she is feeling better.

## 2019-07-03 ENCOUNTER — Other Ambulatory Visit: Payer: Self-pay

## 2019-07-03 ENCOUNTER — Ambulatory Visit (HOSPITAL_COMMUNITY)
Admission: EM | Admit: 2019-07-03 | Discharge: 2019-07-03 | Disposition: A | Discharge status: Home / Self Care | Payer: Medicaid Other | Attending: Family Medicine | Admitting: Family Medicine

## 2019-07-03 ENCOUNTER — Encounter (HOSPITAL_COMMUNITY): Payer: Self-pay

## 2019-07-03 DIAGNOSIS — N898 Other specified noninflammatory disorders of vagina: Secondary | ICD-10-CM | POA: Diagnosis not present

## 2019-07-03 MED ORDER — METRONIDAZOLE 500 MG PO TABS: 500.0000 mg | ORAL_TABLET | Freq: Two times a day (BID) | ORAL | 0 refills | Status: DC

## 2019-07-03 NOTE — ED Triage Notes (Signed)
Pt reports brown vaginal discharge x 1 week. Pt had not tried any medication for the complaint.

## 2019-07-03 NOTE — Discharge Instructions (Signed)
Treating you for bacterial vaginosis.  We will send the swab for further STD screening Follow up as needed for continued or worsening symptoms

## 2019-07-03 NOTE — ED Provider Notes (Signed)
[facility]    CSN: [id number] Arrival date & time: 07/03/19  0805      History   Chief Complaint Chief Complaint  Patient presents with  . Vaginal Discharge    HPI [name] is a 49 y.o. female.   Patient is a 49 year old female presents today with vaginal discharge.  Describes as brown, thin and watery.  History of BV.  Denies any specific odor to the discharge.  Had unprotected sex approximately 2 weeks ago.  No vaginal itching, dysuria or hematuria.  No abdominal pain.  ROS per HPI      Past Medical History:  Diagnosis Date  . Constipation   . Depression   . History of hepatomegaly   . Hypertension     There are no problems to display for this patient.   Past Surgical History:  Procedure Laterality Date  . CESAREAN SECTION      OB History   No obstetric history on file.      Home Medications    Prior to Admission medications   Medication Sig Start Date End Date Taking? Authorizing Provider  Aspirin-Salicylamide-Caffeine (BC HEADACHE POWDER PO) Take 1 packet by mouth as needed (for pain).    [provider]  atorvastatin (LIPITOR) 20 MG tablet Take 20 mg by mouth daily. 09/04/16   [provider]  docusate sodium (COLACE) 100 MG capsule Take 1 capsule (100 mg total) by mouth daily. 02/18/15   Little, Ambrose Finland, MD  metroNIDAZOLE (FLAGYL) 500 MG tablet Take 1 tablet (500 mg total) by mouth 2 (two) times daily. 07/03/19   Dahlia Byes A, NP  ondansetron (ZOFRAN ODT) 4 MG disintegrating tablet Take 1 tablet (4 mg total) by mouth every 8 (eight) hours as needed for nausea or vomiting. 09/19/15   Street, [place], PA-C  polyethylene glycol powder (GLYCOLAX/MIRALAX) powder Take 17 g by mouth 2 (two) times daily. Until daily soft stools  OTC 11/07/15   Barrett Henle, PA-C  risperiDONE (RISPERDAL) 1 MG tablet Take 1 mg by mouth at bedtime. 01/22/15   [provider]  sertraline (ZOLOFT) 100 MG tablet Take  200 mg by mouth daily with breakfast. 12/24/14   [provider]    Family History History reviewed. No pertinent family history.  Social History Social History   Tobacco Use  . Smoking status: Current Every Day Smoker    Packs/day: 0.50  . Smokeless tobacco: Never Used  Substance Use Topics  . Alcohol use: No  . Drug use: No     Allergies   Patient has no known allergies.   Review of Systems Review of Systems   Physical Exam Triage Vital Signs ED Triage Vitals  Enc Vitals Group     BP 07/03/19 0829 116/82     Pulse Rate 07/03/19 0829 96     Resp 07/03/19 0829 20     Temp 07/03/19 0829 98.3 F (36.8 C)     Temp Source 07/03/19 0829 Oral     SpO2 07/03/19 0829 95 %     Weight --      Height --      Head Circumference --      Peak Flow --      Pain Score 07/03/19 0832 0     Pain Loc --      Pain Edu? --      Excl. in GC? --    No data found.  Updated Vital Signs BP 116/82 (BP Location: Left  Arm)   Pulse 96   Temp 98.3 F (36.8 C) (Oral)   Resp 20   LMP 10/08/2015   SpO2 95%   Visual Acuity Right Eye Distance:   Left Eye Distance:   Bilateral Distance:    Right Eye Near:   Left Eye Near:    Bilateral Near:     Physical Exam Vitals and nursing note reviewed.  Constitutional:      General: She is not in acute distress.    Appearance: Normal appearance. She is not ill-appearing, toxic-appearing or diaphoretic.  HENT:     Head: Normocephalic.     Nose: Nose normal.  Eyes:     Conjunctiva/sclera: Conjunctivae normal.  Pulmonary:     Effort: Pulmonary effort is normal.  Musculoskeletal:        General: Normal range of motion.     Cervical back: Normal range of motion.  Skin:    General: Skin is warm and dry.     Findings: No rash.  Neurological:     Mental Status: She is alert.  Psychiatric:        Mood and Affect: Mood normal.      UC Treatments / Results  Labs (all labs ordered are listed, but only abnormal results are  displayed) Labs Reviewed  CERVICOVAGINAL ANCILLARY ONLY    EKG   Radiology No results found.  Procedures Procedures (including critical care time)  Medications Ordered in UC Medications - No data to display  Initial Impression / Assessment and Plan / UC Course  I have reviewed the triage vital signs and the nursing notes.  Pertinent labs & imaging results that were available during my care of the patient were reviewed by me and considered in my medical decision making (see chart for details).     Vaginal discharge.  Swab sent for STD screening Will treat for BV pending results.   Final Clinical Impressions(s) / UC Diagnoses   Final diagnoses:  Vaginal discharge     Discharge Instructions     Treating you for bacterial vaginosis.  We will send the swab for further STD screening Follow up as needed for continued or worsening symptoms     ED Prescriptions    Medication Sig Dispense Auth. Provider   metroNIDAZOLE (FLAGYL) 500 MG tablet Take 1 tablet (500 mg total) by mouth 2 (two) times daily. 14 tablet Aurelio Mccamy A, NP     PDMP not reviewed this encounter.   Orvan July, NP 07/03/19 [phone]

## 2019-07-06 LAB — CERVICOVAGINAL ANCILLARY ONLY
Chlamydia: NEGATIVE
Comment: NEGATIVE
Comment: NORMAL
Neisseria Gonorrhea: NEGATIVE

## 2019-12-18 ENCOUNTER — Other Ambulatory Visit: Payer: Medicaid Other

## 2019-12-18 DIAGNOSIS — Z20822 Contact with and (suspected) exposure to covid-19: Secondary | ICD-10-CM

## 2019-12-20 LAB — NOVEL CORONAVIRUS, NAA: SARS-CoV-2, NAA: NOT DETECTED

## 2019-12-20 LAB — SARS-COV-2, NAA 2 DAY TAT

## 2021-08-16 ENCOUNTER — Ambulatory Visit (HOSPITAL_COMMUNITY)
Admission: EM | Admit: 2021-08-16 | Discharge: 2021-08-16 | Disposition: A | Discharge status: Home / Self Care | Payer: Medicaid Other | Attending: Family Medicine | Admitting: Family Medicine

## 2021-08-16 ENCOUNTER — Encounter (HOSPITAL_COMMUNITY): Payer: Self-pay

## 2021-08-16 DIAGNOSIS — N898 Other specified noninflammatory disorders of vagina: Secondary | ICD-10-CM | POA: Diagnosis not present

## 2021-08-16 NOTE — ED Provider Notes (Signed)
[facility]   [id number] 08/16/21 Arrival Time: 0827  ASSESSMENT & PLAN:  1. Vaginal discharge    -Patient with 1 week of nonspecific vaginal discharge and recent possible STI exposure.  Will send vaginal swab for testing.  Will call with results and treatment if necessary.  No orders of the defined types were placed in this encounter.    Discharge Instructions      You have vaginal discharge We will send your swab to the lab for analysis We will get results in 24-48 hrs We will call you with results and treatment if positive      Reviewed expectations re: course of current medical issues. Questions answered. Outlined signs and symptoms indicating need for more acute intervention. Patient verbalized understanding. After Visit Summary given.   SUBJECTIVE: Patient was urgent care to be evaluated for vaginal discharge.  This is been present for 1 week.  She describes it as dark green/brown in color and has a foul odor.  This has happened before, most recently in 2021.  She denies any vaginal bleeding, vaginal itching, fevers, flank pain or dysuria.  She does have a recent possible STI exposure.  She says she caught her boyfriend cheating on her about 2 weeks ago.  She subsequently developed the vaginal discharge 1 week later.  She says she does have a history of STI, gonorrhea as a teenager, which was treated.  She has a history of tubal ligation.  She reports she has a recently negative HIV blood test from her PCP.  Patient's last menstrual period was 10/08/2015. Past Surgical History:  Procedure Laterality Date   CESAREAN SECTION       OBJECTIVE:  Vitals:   08/16/21 0836  BP: (!) 174/109  Pulse: 93  Resp: 16  Temp: 98.2 F (36.8 C)  TempSrc: Oral  SpO2: 98%     Physical Exam Vitals reviewed.  Constitutional:      Appearance: She is obese.  Cardiovascular:     Rate and Rhythm: Normal rate.  Pulmonary:     Effort: Pulmonary effort is normal.   Abdominal:     Palpations: Abdomen is soft.     Tenderness: There is no abdominal tenderness. There is no guarding.  Musculoskeletal:        General: Normal range of motion.  Skin:    General: Skin is warm and dry.  Neurological:     General: No focal deficit present.     Mental Status: She is alert and oriented to person, place, and time.  Psychiatric:        Mood and Affect: Mood normal.      Labs: Results for orders placed or performed in visit on 12/18/19  Novel Coronavirus, NAA ([facility])   Specimen: Nasopharyngeal(NP) swabs in vial transport medium   Nasopharynge  Screenin  Result Value Ref Range   SARS-CoV-2, NAA Not Detected Not Detected  SARS-COV-2, NAA 2 DAY TAT   Nasopharynge  Screenin  Result Value Ref Range   SARS-CoV-2, NAA 2 DAY TAT Performed    Labs Reviewed  CERVICOVAGINAL ANCILLARY ONLY    Imaging: No results found.   No Known Allergies                                             Past Medical History:  Diagnosis Date   Constipation    Depression  History of hepatomegaly    Hypertension     Social History   Socioeconomic History   Marital status: Single    Spouse name: Not on file   Number of children: Not on file   Years of education: Not on file   Highest education level: Not on file  Occupational History   Not on file  Tobacco Use   Smoking status: Every Day    Packs/day: 0.50    Types: Cigarettes   Smokeless tobacco: Never  Substance and Sexual Activity   Alcohol use: No   Drug use: No   Sexual activity: Not on file  Other Topics Concern   Not on file  Social History Narrative   Not on file   Social Determinants of Health   Financial Resource Strain: Not on file  Food Insecurity: Not on file  Transportation Needs: Not on file  Physical Activity: Not on file  Stress: Not on file  Social Connections: Not on file  Intimate Partner Violence: Not on file    History reviewed. No pertinent family history.    Shubh Chiara,  Baldemar Friday, MD 08/16/21 [phone]

## 2021-08-16 NOTE — Discharge Instructions (Addendum)
You have vaginal discharge We will send your swab to the lab for analysis We will get results in 24-48 hrs We will call you with results and treatment if positive

## 2021-08-16 NOTE — ED Triage Notes (Signed)
Pt presents with c/o vaginal d/c x 1 week.

## 2021-08-17 LAB — CERVICOVAGINAL ANCILLARY ONLY
Comment: NEGATIVE
Comment: NEGATIVE
Comment: NEGATIVE
Comment: NEGATIVE
Comment: NEGATIVE
Comment: NORMAL

## 2021-08-24 ENCOUNTER — Encounter (HOSPITAL_COMMUNITY): Payer: Self-pay | Admitting: Emergency Medicine

## 2021-08-24 ENCOUNTER — Ambulatory Visit (HOSPITAL_COMMUNITY)
Admission: EM | Admit: 2021-08-24 | Discharge: 2021-08-24 | Disposition: A | Discharge status: Home / Self Care | Payer: Medicaid Other | Attending: Physician Assistant | Admitting: Physician Assistant

## 2021-08-24 DIAGNOSIS — R300 Dysuria: Secondary | ICD-10-CM | POA: Diagnosis not present

## 2021-08-24 DIAGNOSIS — Z113 Encounter for screening for infections with a predominantly sexual mode of transmission: Secondary | ICD-10-CM | POA: Diagnosis not present

## 2021-08-24 DIAGNOSIS — N898 Other specified noninflammatory disorders of vagina: Secondary | ICD-10-CM

## 2021-08-24 LAB — POCT URINALYSIS DIPSTICK, ED / UC
Bilirubin Urine: NEGATIVE
Glucose, UA: NEGATIVE mg/dL
Ketones, ur: NEGATIVE mg/dL
Nitrite: NEGATIVE
Protein, ur: 30 mg/dL — AB
Specific Gravity, Urine: >=1.03 (ref 1.005–1.030)
Urobilinogen, UA: 1 mg/dL (ref 0.0–1.0)
pH: 5.5 (ref 5.0–8.0)

## 2021-08-24 LAB — POC URINE PREG, ED: Preg Test, Ur: NEGATIVE

## 2021-08-24 MED ORDER — METRONIDAZOLE 500 MG PO TABS: 500.0000 mg | ORAL_TABLET | Freq: Two times a day (BID) | ORAL | 0 refills | Status: DC

## 2021-08-24 NOTE — ED Provider Notes (Signed)
[facility]    CSN: [id number] Arrival date & time: 08/24/21  2620      History   Chief Complaint Chief Complaint  Patient presents with   Vaginal Discharge    HPI [name] is a 51 y.o. female.   Patient presents today with a several week history of vaginal discharge.  She describes this as dark in color with associated odor.  Denies any abdominal pain, pelvic pain, fever, nausea, vomiting.  She does report some discomfort with urination but denies significant dysuria.  Denies urinary frequency or urgency.  She was seen 08/16/2021 for similar symptoms at which point her swab had insufficient specimen for analysis.  She has tried over-the-counter medication for symptom relief.  Denies any medication changes or antibiotic use.  She does have a history of diabetes but does not take SGLT2 inhibitor.  Denies any changes to personal hygiene products including soaps or detergents.  She is wanting treatment if appropriate today.    Past Medical History:  Diagnosis Date   Constipation    Depression    History of hepatomegaly    Hypertension     There are no problems to display for this patient.   Past Surgical History:  Procedure Laterality Date   CESAREAN SECTION      OB History   No obstetric history on file.      Home Medications    Prior to Admission medications   Medication Sig Start Date End Date Taking? Authorizing Provider  atorvastatin (LIPITOR) 20 MG tablet Take 20 mg by mouth daily. 09/04/16  Yes [provider]  lisinopril (ZESTRIL) 40 MG tablet Take 40 mg by mouth daily. 08/01/21  Yes [provider]  metformin (FORTAMET) 500 MG (OSM) 24 hr tablet Take 500 mg by mouth daily with breakfast.   Yes [provider]  metoprolol succinate (TOPROL-XL) 50 MG 24 hr tablet Take 50 mg by mouth daily. 05/24/21  Yes [provider]  metroNIDAZOLE (FLAGYL) 500 MG tablet Take 1 tablet (500 mg total) by mouth 2 (two) times  daily. 08/24/21  Yes Raylyn Carton K, PA-C  risperiDONE (RISPERDAL) 1 MG tablet Take 1 mg by mouth at bedtime. 01/22/15  Yes [provider]  sertraline (ZOLOFT) 100 MG tablet Take 200 mg by mouth daily with breakfast. 12/24/14  Yes [provider]  Aspirin-Salicylamide-Caffeine (BC HEADACHE POWDER PO) Take 1 packet by mouth as needed (for pain).    [provider]  polyethylene glycol powder (GLYCOLAX/MIRALAX) powder Take 17 g by mouth 2 (two) times daily. Until daily soft stools  OTC 11/07/15   Barrett Henle, PA-C    Family History History reviewed. No pertinent family history.  Social History Social History   Tobacco Use   Smoking status: Every Day    Packs/day: 0.50    Types: Cigarettes   Smokeless tobacco: Never  Substance Use Topics   Alcohol use: No   Drug use: No     Allergies   Patient has no known allergies.   Review of Systems Review of Systems  Constitutional:  Negative for activity change, appetite change, fatigue and fever.  Respiratory:  Negative for cough and shortness of breath.   Cardiovascular:  Negative for chest pain.  Gastrointestinal:  Negative for abdominal pain, diarrhea, nausea and vomiting.  Genitourinary:  Positive for dysuria and vaginal discharge. Negative for frequency, urgency, vaginal bleeding and vaginal pain.     Physical Exam Triage Vital Signs ED Triage Vitals  Enc Vitals Group     BP 08/24/21 0913 (!) 158/109     Pulse Rate 08/24/21 0913 91     Resp 08/24/21 0913 18     Temp 08/24/21 0913 98.3 F (36.8 C)     Temp Source 08/24/21 0913 Oral     SpO2 08/24/21 0913 98 %     Weight --      Height --      Head Circumference --      Peak Flow --      Pain Score 08/24/21 0918 2     Pain Loc --      Pain Edu? --      Excl. in GC? --    No data found.  Updated Vital Signs BP (!) 158/109 (BP Location: Left Arm)   Pulse 91   Temp 98.3 F (36.8 C) (Oral)   Resp 18   LMP 05/27/2021  (Approximate)   SpO2 98%   Visual Acuity Right Eye Distance:   Left Eye Distance:   Bilateral Distance:    Right Eye Near:   Left Eye Near:    Bilateral Near:     Physical Exam Vitals reviewed.  Constitutional:      General: She is awake. She is not in acute distress.    Appearance: Normal appearance. She is well-developed. She is not ill-appearing.     Comments: Very pleasant female appears stated age no acute distress sitting comfortably in exam room  HENT:     Head: Normocephalic and atraumatic.  Cardiovascular:     Rate and Rhythm: Normal rate and regular rhythm.     Heart sounds: Normal heart sounds, S1 normal and S2 normal. No murmur heard. Pulmonary:     Effort: Pulmonary effort is normal.     Breath sounds: Normal breath sounds. No wheezing, rhonchi or rales.     Comments: Clear to auscultation bilaterally Abdominal:     General: Bowel sounds are normal.     Palpations: Abdomen is soft.     Tenderness: There is no abdominal tenderness. There is no right CVA tenderness, left CVA tenderness, guarding or rebound.     Comments: Benign abdominal exam  Psychiatric:        Behavior: Behavior is cooperative.      UC Treatments / Results  Labs (all labs ordered are listed, but only abnormal results are displayed) Labs Reviewed  POCT URINALYSIS DIPSTICK, ED / UC - Abnormal; Notable for the following components:      Result Value   Hgb urine dipstick TRACE (*)    Protein, ur 30 (*)    Leukocytes,Ua SMALL (*)    All other components within normal limits  URINE CULTURE  POC URINE PREG, ED  CERVICOVAGINAL ANCILLARY ONLY    EKG   Radiology No results found.  Procedures Procedures (including critical care time)  Medications Ordered in UC Medications - No data to display  Initial Impression / Assessment and Plan / UC Course  I have reviewed the triage vital signs and the nursing notes.  Pertinent labs & imaging results that were available during my care of  the patient were reviewed by me and considered in my medical decision making (see chart for details).     UA obtained given dysuria showed small leukocytes and trace hemoglobin otherwise was normal.  Suspect that this is related to vaginitis based on clinical presentation.  We will send this for culture but defer antibiotics until culture results are available.  Will empirically treat for bacterial vaginosis given clinical presentation.  Patient started on metronidazole with instruction to avoid alcohol while on this medication due to Antabuse side effects.  She is to rest and explained fluid.  Recommended hypoallergenic soaps and detergents and wearing loosefitting cotton underwear.  Offered HIV and syphilis testing which she declined.  Discussed that if her symptoms worsen in any way and she develops abdominal pain, pelvic pain, fever, nausea, vomiting she needs to be seen immediately to which she expressed understanding.  Strict return precautions given.  Final Clinical Impressions(s) / UC Diagnoses   Final diagnoses:  Vaginal discharge  Vaginal odor  Routine screening for STI (sexually transmitted infection)  Dysuria     Discharge Instructions      I suspect you have bacterial vaginosis.  We are starting metronidazole to treat this infection.  Please take this twice daily for 7 days.  Do not drink any alcohol while on this medication for 3 days after completing course of medicine as I will cause you to vomit.  Wear loosefitting cotton underwear and use hypoallergenic soaps and detergents.  We will contact you if any of your other testing is positive we will need to change your treatment plan.  Make sure you rest and drink plenty of fluid.  If you have any worsening symptoms including abdominal pain, pelvic pain, fever, nausea, vomiting you need to be seen immediately.     ED Prescriptions     Medication Sig Dispense Auth. Provider   metroNIDAZOLE (FLAGYL) 500 MG tablet Take 1 tablet  (500 mg total) by mouth 2 (two) times daily. 14 tablet Chrystle Murillo, Noberto Retort, PA-C      PDMP not reviewed this encounter.   Jeani Hawking, PA-C 08/24/21 0300

## 2021-08-24 NOTE — ED Triage Notes (Signed)
Patient c/o vaginal discharge x 2 weeks.   Patient denies N/V/D or fever.   Patient endorses " irritation when peeing".   Patient endorses abnormal vaginal odor.   Patient endorses " green to brown" discharge.   Patient reports being presents in clinic last week and needing to be retested.   Patient has tried OTC yeast medication with no relief of symptoms.

## 2021-08-24 NOTE — Discharge Instructions (Addendum)
I suspect you have bacterial vaginosis.  We are starting metronidazole to treat this infection.  Please take this twice daily for 7 days.  Do not drink any alcohol while on this medication for 3 days after completing course of medicine as I will cause you to vomit.  Wear loosefitting cotton underwear and use hypoallergenic soaps and detergents.  We will contact you if any of your other testing is positive we will need to change your treatment plan.  Make sure you rest and drink plenty of fluid.  If you have any worsening symptoms including abdominal pain, pelvic pain, fever, nausea, vomiting you need to be seen immediately.

## 2021-08-25 LAB — CERVICOVAGINAL ANCILLARY ONLY
Bacterial Vaginitis (gardnerella): POSITIVE — AB
Candida Glabrata: NEGATIVE
Candida Vaginitis: NEGATIVE
Chlamydia: NEGATIVE
Comment: NEGATIVE
Comment: NEGATIVE
Comment: NEGATIVE
Comment: NEGATIVE
Comment: NEGATIVE
Comment: NORMAL
Neisseria Gonorrhea: NEGATIVE
Trichomonas: POSITIVE — AB

## 2021-08-25 LAB — URINE CULTURE: Culture: NO GROWTH

## 2023-04-05 ENCOUNTER — Ambulatory Visit (HOSPITAL_COMMUNITY)
Admission: EM | Admit: 2023-04-05 | Discharge: 2023-04-05 | Disposition: A | Discharge status: Home / Self Care | Payer: MEDICAID | Attending: Emergency Medicine | Admitting: Emergency Medicine

## 2023-04-05 ENCOUNTER — Encounter (HOSPITAL_COMMUNITY): Payer: Self-pay

## 2023-04-05 DIAGNOSIS — N898 Other specified noninflammatory disorders of vagina: Secondary | ICD-10-CM | POA: Diagnosis present

## 2023-04-05 DIAGNOSIS — I10 Essential (primary) hypertension: Secondary | ICD-10-CM | POA: Diagnosis present

## 2023-04-05 DIAGNOSIS — Z3202 Encounter for pregnancy test, result negative: Secondary | ICD-10-CM | POA: Diagnosis not present

## 2023-04-05 LAB — POCT URINALYSIS DIP (MANUAL ENTRY)
Bilirubin, UA: NEGATIVE
Blood, UA: NEGATIVE
Glucose, UA: NEGATIVE mg/dL
Ketones, POC UA: NEGATIVE mg/dL
Leukocytes, UA: NEGATIVE
Nitrite, UA: NEGATIVE
Spec Grav, UA: >=1.03 — AB (ref 1.010–1.025)
Urobilinogen, UA: 0.2 U/dL
pH, UA: 5.5 (ref 5.0–8.0)

## 2023-04-05 LAB — POCT URINE PREGNANCY: Preg Test, Ur: NEGATIVE

## 2023-04-05 MED ORDER — METOPROLOL SUCCINATE ER 50 MG PO TB24: 50.0000 mg | ORAL_TABLET | Freq: Every day | ORAL | 0 refills | Status: DC

## 2023-04-05 MED ORDER — LISINOPRIL 40 MG PO TABS: 40.0000 mg | ORAL_TABLET | Freq: Every day | ORAL | 0 refills | Status: DC

## 2023-04-05 NOTE — ED Triage Notes (Signed)
 Patient reports that she has had vaginal discharge x 2 days.  Patient is also requesting medication refill for Lisinopril 40 mg and Metoprolol 50 mg. Patient states she is looking for a PCP and has been out of her medications x 4 days.

## 2023-04-05 NOTE — ED Provider Notes (Signed)
 [facility]    CSN: [id number] Arrival date & time: 04/05/23  0818      History   Chief Complaint Chief Complaint  Patient presents with   Vaginal Discharge   Medication Refill    HPI [name] is a 53 y.o. female.   Patient presents to clinic over concerns of vaginal discharge for the past two days that has been clear with a slight odor.  She is sexually active with a consistent partner, but has caught him cheating on her in the past.  They will sometimes use condoms.  She was last sexually active with him about a week ago.  She is postmenopausal.  Denies any abdominal pain, fever, flank pain, urinary urgency, frequency or dysuria.  Patient would also like a refill of her metoprolol and lisinopril.  She has been taking these consistently for a while but has been out of them for the past 4 days.  She did have a primary care provider but missed a few appointments and they dismissed her from the practice.  Denies any vision changes, headache or chest pain.  The history is provided by the patient and medical records.  Vaginal Discharge Medication Refill   Past Medical History:  Diagnosis Date   Constipation    Depression    History of hepatomegaly    Hypertension     There are no active problems to display for this patient.   Past Surgical History:  Procedure Laterality Date   CESAREAN SECTION      OB History   No obstetric history on file.      Home Medications    Prior to Admission medications   Medication Sig Start Date End Date Taking? Authorizing Provider  Aspirin-Salicylamide-Caffeine (BC HEADACHE POWDER PO) Take 1 packet by mouth as needed (for pain).    [provider]  atorvastatin (LIPITOR) 20 MG tablet Take 20 mg by mouth daily. 09/04/16   [provider]  lisinopril (ZESTRIL) 40 MG tablet Take 1 tablet (40 mg total) by mouth daily. 04/05/23   Dniyah Grant, [place] N, FNP  metformin (FORTAMET) 500 MG (OSM) 24 hr tablet  Take 500 mg by mouth daily with breakfast.    [provider]  metoprolol succinate (TOPROL-XL) 50 MG 24 hr tablet Take 1 tablet (50 mg total) by mouth daily. 04/05/23   Gladine Plude, [place] N, FNP  risperiDONE (RISPERDAL) 1 MG tablet Take 1 mg by mouth at bedtime. 01/22/15   [provider]  sertraline (ZOLOFT) 100 MG tablet Take 200 mg by mouth daily with breakfast. 12/24/14   [provider]    Family History Family History  Problem Relation Age of Onset   Heart failure Mother     Social History Social History   Tobacco Use   Smoking status: Every Day    Current packs/day: 0.50    Types: Cigarettes   Smokeless tobacco: Never  Vaping Use   Vaping status: Former  Substance Use Topics   Alcohol use: No   Drug use: No     Allergies   Patient has no known allergies.   Review of Systems Review of Systems  Per HPI  Physical Exam Triage Vital Signs ED Triage Vitals  Encounter Vitals Group     BP 04/05/23 0857 (!) 161/110     Systolic BP Percentile --      Diastolic BP Percentile --      Pulse Rate 04/05/23 0857 86     Resp 04/05/23 0857  16     Temp 04/05/23 0857 98.1 F (36.7 C)     Temp Source 04/05/23 0857 Oral     SpO2 04/05/23 0857 96 %     Weight --      Height --      Head Circumference --      Peak Flow --      Pain Score 04/05/23 0859 0     Pain Loc --      Pain Education --      Exclude from Growth Chart --    No data found.  Updated Vital Signs BP (!) 161/110 (BP Location: Right Arm) Comment: Patient has been out of Lisinopril and Metoprolol x 4 days.  Pulse 86   Temp 98.1 F (36.7 C) (Oral)   Resp 16   LMP 05/27/2021 (Approximate)   SpO2 96%   Visual Acuity Right Eye Distance:   Left Eye Distance:   Bilateral Distance:    Right Eye Near:   Left Eye Near:    Bilateral Near:     Physical Exam Vitals and nursing note reviewed.  Constitutional:      Appearance: Normal appearance.  HENT:     Head: Normocephalic  and atraumatic.     Right Ear: External ear normal.     Left Ear: External ear normal.     Nose: Nose normal.     Mouth/Throat:     Mouth: Mucous membranes are moist.  Eyes:     Conjunctiva/sclera: Conjunctivae normal.  Cardiovascular:     Rate and Rhythm: Normal rate and regular rhythm.     Heart sounds: Normal heart sounds. No murmur heard. Pulmonary:     Effort: Pulmonary effort is normal. No respiratory distress.     Breath sounds: Normal breath sounds.  Skin:    General: Skin is warm and dry.  Neurological:     General: No focal deficit present.     Mental Status: She is alert.  Psychiatric:        Mood and Affect: Mood normal.      UC Treatments / Results  Labs (all labs ordered are listed, but only abnormal results are displayed) Labs Reviewed  POCT URINALYSIS DIP (MANUAL ENTRY) - Abnormal; Notable for the following components:      Result Value   Clarity, UA cloudy (*)    Spec Grav, UA >=1.030 (*)    Protein Ur, POC trace (*)    All other components within normal limits  POCT URINE PREGNANCY - Normal  CERVICOVAGINAL ANCILLARY ONLY    EKG   Radiology No results found.  Procedures Procedures (including critical care time)  Medications Ordered in UC Medications - No data to display  Initial Impression / Assessment and Plan / UC Course  I have reviewed the triage vital signs and the nursing notes.  Pertinent labs & imaging results that were available during my care of the patient were reviewed by me and considered in my medical decision making (see chart for details).  Vitals and triage reviewed, patient is hemodynamically stable.  Lungs are vesicular, heart with regular rate and rhythm.  She is hypertensive in clinic without signs of hypertensive urgency or emergency.  Will refill hypertensive medications and provide with a mobile clinic calendar.   Vaginal discharge is watery with odor.  Without signs of UTI or PID.  Urinalysis with trace protein.   Urine pregnancy is negative.  Cytology swab obtained and staff will contact if treatment is indicated based  on results.  Plan of care, follow-up care return precautions given, no questions at this time.  Work note provided.    Final Clinical Impressions(s) / UC Diagnoses   Final diagnoses:  Essential hypertension  Vaginal discharge     Discharge Instructions      Your MyChart username is: [email or web address]   I have refilled your blood pressure medication for the next 30 days.  I have attached the mobile clinic schedule for you to follow-up with for further refills and blood pressure evaluation.  The vaginal swab will be available over the next few days and our staff will contact you if any treatment is indicated based on your results.  Abstain from intercourse until all results have been received and notify any sexual partners of any positive sexually transmitted infections.     ED Prescriptions     Medication Sig Dispense Auth. Provider   lisinopril (ZESTRIL) 40 MG tablet Take 1 tablet (40 mg total) by mouth daily. 30 tablet Rinaldo Ratel, [place] N, [place]   metoprolol succinate (TOPROL-XL) 50 MG 24 hr tablet Take 1 tablet (50 mg total) by mouth daily. 30 tablet Laverne Klugh, [place] N, [place]      PDMP not reviewed this encounter.   Rinaldo Ratel [place] N, [place] 04/05/23 [phone]

## 2023-04-05 NOTE — Discharge Instructions (Addendum)
 Your MyChart username is: [email or web address]   I have refilled your blood pressure medication for the next 30 days.  I have attached the mobile clinic schedule for you to follow-up with for further refills and blood pressure evaluation.  The vaginal swab will be available over the next few days and our staff will contact you if any treatment is indicated based on your results.  Abstain from intercourse until all results have been received and notify any sexual partners of any positive sexually transmitted infections.

## 2023-04-08 ENCOUNTER — Telehealth (HOSPITAL_COMMUNITY): Payer: Self-pay

## 2023-04-08 LAB — CERVICOVAGINAL ANCILLARY ONLY
Bacterial Vaginitis (gardnerella): POSITIVE — AB
Candida Glabrata: NEGATIVE
Candida Vaginitis: NEGATIVE
Chlamydia: NEGATIVE
Comment: NEGATIVE
Comment: NEGATIVE
Comment: NEGATIVE
Comment: NEGATIVE
Comment: NEGATIVE
Comment: NORMAL
Neisseria Gonorrhea: NEGATIVE
Trichomonas: NEGATIVE

## 2023-04-08 MED ORDER — METRONIDAZOLE 500 MG PO TABS: 500.0000 mg | ORAL_TABLET | Freq: Two times a day (BID) | ORAL | 0 refills | Status: AC

## 2023-04-08 NOTE — Telephone Encounter (Signed)
 Per protocol, pt requires tx with metronidazole. Reviewed with patient, verified pharmacy, prescription sent.

## 2023-06-05 ENCOUNTER — Ambulatory Visit: Payer: MEDICAID | Admitting: Physician Assistant

## 2023-06-05 ENCOUNTER — Encounter: Payer: Self-pay | Admitting: Physician Assistant

## 2023-06-05 VITALS — BP 177/106 | HR 100 | Ht 63.0 in | Wt 270.0 lb

## 2023-06-05 DIAGNOSIS — I10 Essential (primary) hypertension: Secondary | ICD-10-CM

## 2023-06-05 DIAGNOSIS — Z1231 Encounter for screening mammogram for malignant neoplasm of breast: Secondary | ICD-10-CM

## 2023-06-05 DIAGNOSIS — E119 Type 2 diabetes mellitus without complications: Secondary | ICD-10-CM | POA: Diagnosis not present

## 2023-06-05 DIAGNOSIS — I160 Hypertensive urgency: Secondary | ICD-10-CM | POA: Diagnosis not present

## 2023-06-05 DIAGNOSIS — Z7984 Long term (current) use of oral hypoglycemic drugs: Secondary | ICD-10-CM

## 2023-06-05 DIAGNOSIS — E66813 Obesity, class 3: Secondary | ICD-10-CM

## 2023-06-05 LAB — POCT GLYCOSYLATED HEMOGLOBIN (HGB A1C): Hemoglobin A1C: 5.6 % (ref 4.0–5.6)

## 2023-06-05 MED ORDER — LISINOPRIL 40 MG PO TABS: 40.0000 mg | ORAL_TABLET | Freq: Every day | ORAL | 0 refills | Status: DC

## 2023-06-05 MED ORDER — METFORMIN HCL ER (OSM) 500 MG PO TB24: 500.0000 mg | ORAL_TABLET | Freq: Every day | ORAL | 1 refills | Status: DC

## 2023-06-05 MED ORDER — CLONIDINE HCL 0.1 MG PO TABS
0.1000 mg | ORAL_TABLET | Freq: Once | ORAL | Status: AC
  Administered 2023-06-05: 0.1 mg via ORAL

## 2023-06-05 MED ORDER — METOPROLOL SUCCINATE ER 50 MG PO TB24: 50.0000 mg | ORAL_TABLET | Freq: Every day | ORAL | 0 refills | Status: DC

## 2023-06-05 NOTE — Progress Notes (Unsigned)
 New Patient Office Visit  Subjective    Patient ID: [name], female    DOB: [birth year]  Age: 53 y.o. MRN: [id number]  CC:  Chief Complaint  Patient presents with   Medication Refill    Patient has been out of lisinopril , metformin, ad metoprolol   x2 weeks   Discussed the use of AI scribe software for clinical note transcription with the patient, who gave verbal consent to proceed.  History of Present Illness  [name] is a 53 year old female who presents for medication management.  She has been without her hypertension medications, metoprolol  50 mg once daily and lisinopril  40 mg once daily, for the past two weeks. No symptoms related to elevated blood pressure are present. She has not taken metformin 500 mg twice daily for diabetes for about three to four weeks, but states that she has taken it for about 10 years. Does not check BP or BG at home. Her last A1c level is unknown. She receives metformin from a family physician on [address]. She is on cholesterol medication but has not taken it in a "very long time". . She is also on risperdal and prozac, prescribed by [facility], and last saw her about a month ago. These medications are working well for her.    Outpatient Encounter Medications as of 06/05/2023  Medication Sig   Aspirin-Salicylamide-Caffeine (BC HEADACHE POWDER PO) Take 1 packet by mouth as needed (for pain).   atorvastatin (LIPITOR) 20 MG tablet Take 20 mg by mouth daily.   FLUoxetine (PROZAC) 40 MG capsule Take 40 mg by mouth every morning.   hydrOXYzine (VISTARIL) 25 MG capsule Take 25 mg by mouth 4 (four) times daily as needed.   risperiDONE (RISPERDAL) 1 MG tablet Take 1 mg by mouth at bedtime.   sertraline (ZOLOFT) 100 MG tablet Take 200 mg by mouth daily with breakfast.   [DISCONTINUED] lisinopril  (ZESTRIL ) 40 MG tablet Take 1 tablet (40 mg total) by mouth daily.   [DISCONTINUED] metformin (FORTAMET) 500 MG (OSM) 24 hr tablet Take 500 mg  by mouth daily with breakfast.   [DISCONTINUED] metoprolol  succinate (TOPROL -XL) 50 MG 24 hr tablet Take 1 tablet (50 mg total) by mouth daily.   lisinopril  (ZESTRIL ) 40 MG tablet Take 1 tablet (40 mg total) by mouth daily.   metformin (FORTAMET) 500 MG (OSM) 24 hr tablet Take 1 tablet (500 mg total) by mouth daily with breakfast.   metoprolol  succinate (TOPROL -XL) 50 MG 24 hr tablet Take 1 tablet (50 mg total) by mouth daily.   Facility-Administered Encounter Medications as of 06/05/2023  Medication   cloNIDine (CATAPRES) tablet 0.1 mg    Past Medical History:  Diagnosis Date   Constipation    Depression    History of hepatomegaly    Hypertension     Past Surgical History:  Procedure Laterality Date   CESAREAN SECTION      Family History  Problem Relation Age of Onset   Heart failure Mother     Social History   Socioeconomic History   Marital status: Single    Spouse name: Not on file   Number of children: Not on file   Years of education: Not on file   Highest education level: Not on file  Occupational History   Not on file  Tobacco Use   Smoking status: Every Day    Current packs/day: 0.50    Types: Cigarettes   Smokeless tobacco: Never  Vaping Use  Vaping status: Former  Substance and Sexual Activity   Alcohol use: No   Drug use: No   Sexual activity: Not on file  Other Topics Concern   Not on file  Social History Narrative   Not on file   Social Drivers of Health   Financial Resource Strain: Not on file  Food Insecurity: Not on file  Transportation Needs: Not on file  Physical Activity: Not on file  Stress: Not on file  Social Connections: Not on file  Intimate Partner Violence: Not on file    Review of Systems  Constitutional: Negative.   HENT: Negative.    Eyes: Negative.   Respiratory:  Negative for shortness of breath.   Cardiovascular:  Negative for chest pain.  Gastrointestinal: Negative.   Genitourinary: Negative.    Musculoskeletal: Negative.   Skin: Negative.   Neurological: Negative.   Psychiatric/Behavioral: Negative.          Objective    BP (!) 177/106 (BP Location: Left Wrist, Patient Position: Sitting, Cuff Size: Large)   Pulse 100   Ht 5\' 3"  (1.6 m)   Wt 270 lb (122.5 kg)   LMP 05/27/2021 (Approximate)   SpO2 94%   BMI 47.83 kg/m   Physical Exam Vitals and nursing note reviewed.  Constitutional:      Appearance: Normal appearance. She is obese.  HENT:     Head: Normocephalic and atraumatic.     Right Ear: External ear normal.     Left Ear: External ear normal.     Nose: Nose normal.     Mouth/Throat:     Mouth: Mucous membranes are moist.     Pharynx: Oropharynx is clear.  Eyes:     Extraocular Movements: Extraocular movements intact.     Conjunctiva/sclera: Conjunctivae normal.     Pupils: Pupils are equal, round, and reactive to light.  Cardiovascular:     Rate and Rhythm: Normal rate and regular rhythm.     Pulses: Normal pulses.     Heart sounds: Normal heart sounds.  Pulmonary:     Effort: Pulmonary effort is normal.     Breath sounds: Normal breath sounds.  Musculoskeletal:        General: Normal range of motion.     Cervical back: Normal range of motion and neck supple.  Skin:    General: Skin is warm and dry.  Neurological:     General: No focal deficit present.     Mental Status: She is alert and oriented to person, place, and time.  Psychiatric:        Mood and Affect: Mood normal.        Behavior: Behavior normal.        Thought Content: Thought content normal.        Judgment: Judgment normal.        Assessment & Plan:   Problem List Items Addressed This Visit   None Visit Diagnoses       Hypertensive urgency    -  Primary   Relevant Medications   cloNIDine (CATAPRES) tablet 0.1 mg   lisinopril  (ZESTRIL ) 40 MG tablet   metoprolol  succinate (TOPROL -XL) 50 MG 24 hr tablet   Other Relevant Orders   TSH     Essential hypertension        Relevant Medications   cloNIDine (CATAPRES) tablet 0.1 mg   lisinopril  (ZESTRIL ) 40 MG tablet   metoprolol  succinate (TOPROL -XL) 50 MG 24 hr tablet   Other Relevant Orders  CBC with Differential/Platelet   Comp. Metabolic Panel (12)   TSH     Type 2 diabetes mellitus with hyperglycemia, without long-term current use of insulin (HCC)       Relevant Medications   lisinopril  (ZESTRIL ) 40 MG tablet   metformin (FORTAMET) 500 MG (OSM) 24 hr tablet   Other Relevant Orders   Microalbumin / creatinine urine ratio   Vitamin D, 25-hydroxy   POCT glycosylated hemoglobin (Hb A1C) (Completed)     Encounter for screening mammogram for malignant neoplasm of breast       Relevant Orders   MM DIGITAL SCREENING BILATERAL      1. Essential hypertension (Primary) Resume regimen.  Patient encouraged to check blood pressure at home, keep a written log and have available for all office visits.  Red flags given for prompt reevaluation.  Patient to follow-up with mobile unit in 2 weeks. - lisinopril  (ZESTRIL ) 40 MG tablet; Take 1 tablet (40 mg total) by mouth daily.  Dispense: 30 tablet; Refill: 0 - metoprolol  succinate (TOPROL -XL) 50 MG 24 hr tablet; Take 1 tablet (50 mg total) by mouth daily.  Dispense: 30 tablet; Refill: 0 - CBC with Differential/Platelet - Comp. Metabolic Panel (12) - TSH  2. Type 2 diabetes mellitus without complication, without long-term current use of insulin (HCC) A1c 5.6.  Patient has been taking metformin for the past 10 years but has not taken in the last 3 weeks.  Encouraged patient to resume regimen at this time, check blood glucose levels at home, keep a written log and have available for all office visits. - Microalbumin / creatinine urine ratio - Vitamin D, 25-hydroxy - POCT glycosylated hemoglobin (Hb A1C) - metformin (FORTAMET) 500 MG (OSM) 24 hr tablet; Take 1 tablet (500 mg total) by mouth daily with breakfast.  Dispense: 30 tablet; Refill: 1  3. Hypertensive  urgency Red flags given for prompt reevaluation - cloNIDine (CATAPRES) tablet 0.1 mg - TSH  4. Encounter for screening mammogram for malignant neoplasm of breast  - MM DIGITAL SCREENING BILATERAL; Future   I have reviewed the patient's medical history (PMH, PSH, Social History, Family History, Medications, and allergies) , and have been updated if relevant. I spent 30 minutes reviewing chart and  face to face time with patient.    Return in about 2 weeks (around 06/19/2023) for With MMU.   Etter Hermann Mayers, PA-C

## 2023-06-05 NOTE — Patient Instructions (Addendum)
 VISIT SUMMARY:  Today, we discussed your medication management for hypertension, diabetes, and cholesterol. We also reviewed your general health maintenance needs.  YOUR PLAN:  -HYPERTENSION: Hypertension means high blood pressure. Your blood pressure is elevated because you have not been taking your medications. We administered clonidine today to manage it acutely. Please continue taking metoprolol  50 mg once daily and lisinopril  40 mg once daily. We will reassess your blood pressure in two weeks.  -HYPERLIPIDEMIA: Hyperlipidemia means high cholesterol levels. Since you have not been taking your cholesterol medication, we need to check your cholesterol levels. Please fast for four hours before your next visit so we can get accurate results. We will assess your cholesterol levels and determine if you need medication in two weeks.  -GENERAL HEALTH MAINTENANCE: You have not had a recent mammogram. We will order one for you.  How to Take Your Blood Pressure Blood pressure is a measurement of how strongly your blood is pressing against the walls of your arteries. Arteries are blood vessels that carry blood from your heart throughout your body. Your health care provider takes your blood pressure at each office visit. You can also take your own blood pressure at home with a blood pressure monitor. You may need to take your own blood pressure to: Confirm a diagnosis of high blood pressure (hypertension). Monitor your blood pressure over time. Make sure your blood pressure medicine is working. Supplies needed: Blood pressure monitor. A chair to sit in. This should be a chair where you can sit upright with your back supported. Do not sit on a soft couch or an armchair. Table or desk. Small notebook and pencil or pen. How to prepare To get the most accurate reading, avoid the following for 30 minutes before you check your blood pressure: Drinking caffeine. Drinking  alcohol. Eating. Smoking. Exercising. Five minutes before you check your blood pressure: Use the bathroom and urinate so that you have an empty bladder. Sit quietly in a chair. Do not talk. How to take your blood pressure To check your blood pressure, follow the instructions in the manual that came with your blood pressure monitor. If you have a digital blood pressure monitor, the instructions may be as follows: Sit up straight in a chair. Place your feet on the floor. Do not cross your ankles or legs. Rest your left arm at the level of your heart on a table or desk or on the arm of a chair. Pull up your shirt sleeve. Wrap the blood pressure cuff around the upper part of your left arm, 1 inch (2.5 cm) above your elbow. It is best to wrap the cuff around bare skin. Fit the cuff snugly, but not too tightly, around your arm. You should be able to place only one finger between the cuff and your arm. Position the cord so that it rests in the bend of your elbow. Press the power button. Sit quietly while the cuff inflates and deflates. Read the digital reading on the monitor screen and write the numbers down (record them) in a notebook. Wait 2-3 minutes, then repeat the steps, starting at step 1. What does my blood pressure reading mean? A blood pressure reading consists of a higher number over a lower number. Ideally, your blood pressure should be below 120/80. The first ("top") number is called the systolic pressure. It is a measure of the pressure in your arteries as your heart beats. The second ("bottom") number is called the diastolic pressure. It is a  measure of the pressure in your arteries as the heart relaxes. Blood pressure is classified into four stages. The following are the stages for adults who do not have a short-term serious illness or a chronic condition. Systolic pressure and diastolic pressure are measured in a unit called mm Hg (millimeters of mercury).  Normal Systolic pressure:  below 120. Diastolic pressure: below 80. Elevated Systolic pressure: 120-129. Diastolic pressure: below 80. Hypertension stage 1 Systolic pressure: 130-139. Diastolic pressure: 80-89. Hypertension stage 2 Systolic pressure: 140 or above. Diastolic pressure: 90 or above. You can have elevated blood pressure or hypertension even if only the systolic or only the diastolic number in your reading is higher than normal. Follow these instructions at home: Medicines Take over-the-counter and prescription medicines only as told by your health care provider. Tell your health care provider if you are having any side effects from blood pressure medicine. General instructions Check your blood pressure as often as recommended by your health care provider. Check your blood pressure at the same time every day. Take your monitor to the next appointment with your health care provider to make sure that: You are using it correctly. It provides accurate readings. Understand what your goal blood pressure numbers are. Keep all follow-up visits. This is important. General tips Your health care provider can suggest a reliable monitor that will meet your needs. There are several types of home blood pressure monitors. Choose a monitor that has an arm cuff. Do not choose a monitor that measures your blood pressure from your wrist or finger. Choose a cuff that wraps snugly, not too tight or too loose, around your upper arm. You should be able to fit only one finger between your arm and the cuff. You can buy a blood pressure monitor at most drugstores or online. Where to find more information American Heart Association: www.heart.org Contact a health care provider if: Your blood pressure is consistently high. Your blood pressure is suddenly low. Get help right away if: Your systolic blood pressure is higher than 180. Your diastolic blood pressure is higher than 120. These symptoms may be an emergency. Get  help right away. Call 911. Do not wait to see if the symptoms will go away. Do not drive yourself to the hospital. Summary Blood pressure is a measurement of how strongly your blood is pressing against the walls of your arteries. A blood pressure reading consists of a higher number over a lower number. Ideally, your blood pressure should be below 120/80. Check your blood pressure at the same time every day. Avoid caffeine, alcohol, smoking, and exercise for 30 minutes prior to checking your blood pressure. These agents can affect the accuracy of the blood pressure reading. This information is not intended to replace advice given to you by your health care provider. Make sure you discuss any questions you have with your health care provider. Document Revised: 09/08/2020 Document Reviewed: 09/08/2020 Elsevier Patient Education  2024 [employer].

## 2023-06-06 ENCOUNTER — Ambulatory Visit: Payer: Self-pay | Admitting: Physician Assistant

## 2023-06-06 ENCOUNTER — Other Ambulatory Visit: Payer: Self-pay | Admitting: Physician Assistant

## 2023-06-06 ENCOUNTER — Telehealth: Payer: Self-pay

## 2023-06-06 DIAGNOSIS — E119 Type 2 diabetes mellitus without complications: Secondary | ICD-10-CM

## 2023-06-06 DIAGNOSIS — E559 Vitamin D deficiency, unspecified: Secondary | ICD-10-CM

## 2023-06-06 LAB — COMP. METABOLIC PANEL (12)
AST: 12 IU/L (ref 0–40)
Albumin: 4.2 g/dL (ref 3.8–4.9)
Alkaline Phosphatase: 120 IU/L (ref 44–121)
BUN/Creatinine Ratio: 20 (ref 9–23)
BUN: 15 mg/dL (ref 6–24)
Bilirubin Total: 0.3 mg/dL (ref 0.0–1.2)
Calcium: 10 mg/dL (ref 8.7–10.2)
Chloride: 101 mmol/L (ref 96–106)
Creatinine, Ser: 0.76 mg/dL (ref 0.57–1.00)
Globulin, Total: 3.7 g/dL (ref 1.5–4.5)
Glucose: 104 mg/dL — ABNORMAL HIGH (ref 70–99)
Potassium: 5 mmol/L (ref 3.5–5.2)
Sodium: 139 mmol/L (ref 134–144)
Total Protein: 7.9 g/dL (ref 6.0–8.5)
eGFR: 95 mL/min/{1.73_m2} (ref >59)

## 2023-06-06 LAB — CBC WITH DIFFERENTIAL/PLATELET
Basophils Absolute: 0 10*3/uL (ref 0.0–0.2)
Basos: 0 %
EOS (ABSOLUTE): 0.1 10*3/uL (ref 0.0–0.4)
Eos: 2 %
Hematocrit: 41.8 % (ref 34.0–46.6)
Hemoglobin: 12.8 g/dL (ref 11.1–15.9)
Immature Grans (Abs): 0 10*3/uL (ref 0.0–0.1)
Immature Granulocytes: 0 %
Lymphocytes Absolute: 2.8 10*3/uL (ref 0.7–3.1)
Lymphs: 46 %
MCH: 28.3 pg (ref 26.6–33.0)
MCHC: 30.6 g/dL — ABNORMAL LOW (ref 31.5–35.7)
MCV: 93 fL (ref 79–97)
Monocytes Absolute: 0.5 10*3/uL (ref 0.1–0.9)
Monocytes: 8 %
Neutrophils Absolute: 2.6 10*3/uL (ref 1.4–7.0)
Neutrophils: 44 %
Platelets: 295 10*3/uL (ref 150–450)
RBC: 4.52 x10E6/uL (ref 3.77–5.28)
RDW: 12.8 % (ref 11.7–15.4)
WBC: 6 10*3/uL (ref 3.4–10.8)

## 2023-06-06 LAB — TSH: TSH: 1.15 u[IU]/mL (ref 0.450–4.500)

## 2023-06-06 LAB — VITAMIN D 25 HYDROXY (VIT D DEFICIENCY, FRACTURES): Vit D, 25-Hydroxy: 11.4 ng/mL — ABNORMAL LOW (ref 30.0–100.0)

## 2023-06-06 LAB — MICROALBUMIN / CREATININE URINE RATIO
Creatinine, Urine: 141.2 mg/dL
Microalb/Creat Ratio: 8 mg/g{creat} (ref 0–29)
Microalbumin, Urine: 10.9 ug/mL

## 2023-06-06 MED ORDER — VITAMIN D (ERGOCALCIFEROL) 1.25 MG (50000 UNIT) PO CAPS: 50000.0000 [IU] | ORAL_CAPSULE | ORAL | 2 refills | Status: AC

## 2023-06-06 MED ORDER — METFORMIN HCL ER 500 MG PO TB24: 500.0000 mg | ORAL_TABLET | Freq: Every day | ORAL | 1 refills | Status: DC

## 2023-06-06 NOTE — Telephone Encounter (Signed)
 Prior authorization Submitted for review.  Key: [id number]   PA CASE# [id number]  Rx-# [id number]- Metformin HCI ER (OSM) 500 mg ER Tablets.   Awaiting insurance approval.

## 2023-06-06 NOTE — Telephone Encounter (Signed)
 Call Reference# [id number], Prescription was denied for the Metformin  (OSM) 500 mg tablet-ER due to not meeting  recommendations.   [employer] [facility]- Pharmacy services (Rep-Trevon B) Provided preferred list as follows:  Metformin  ER- Tablet  Clipizide-Metformin - Tablet  Glyburide-Metformin - Tablet   (OSM) form is not covered under patients Insurance plan. Do you mind sending in a new Prescription with one of the preferred medications.

## 2023-06-11 NOTE — Telephone Encounter (Signed)
 Patient, has been informed of medication change.

## 2023-06-18 ENCOUNTER — Telehealth: Payer: Self-pay | Admitting: Family Medicine

## 2023-06-18 NOTE — Telephone Encounter (Signed)
 Pt is scheduled for follow up visit 6/11

## 2023-06-19 ENCOUNTER — Encounter: Payer: Self-pay | Admitting: Physician Assistant

## 2023-06-19 ENCOUNTER — Ambulatory Visit: Payer: MEDICAID | Admitting: Physician Assistant

## 2023-06-19 VITALS — BP 163/103 | HR 79 | Ht 63.0 in

## 2023-06-19 DIAGNOSIS — E559 Vitamin D deficiency, unspecified: Secondary | ICD-10-CM | POA: Diagnosis not present

## 2023-06-19 DIAGNOSIS — I10 Essential (primary) hypertension: Secondary | ICD-10-CM | POA: Diagnosis not present

## 2023-06-19 MED ORDER — METOPROLOL SUCCINATE ER 50 MG PO TB24: 50.0000 mg | ORAL_TABLET | Freq: Every day | ORAL | 0 refills | Status: DC

## 2023-06-19 MED ORDER — HYDROCHLOROTHIAZIDE 25 MG PO TABS: 25.0000 mg | ORAL_TABLET | Freq: Every day | ORAL | 1 refills | Status: DC

## 2023-06-19 MED ORDER — LISINOPRIL 40 MG PO TABS: 40.0000 mg | ORAL_TABLET | Freq: Every day | ORAL | 0 refills | Status: DC

## 2023-06-19 NOTE — Progress Notes (Signed)
 Established Patient Office Visit  Subjective   Patient ID: [name], female    DOB: [birth year]  Age: 53 y.o. MRN: [id number]  Chief Complaint  Patient presents with   Hypertension    Discussed the use of AI scribe software for clinical note transcription with the patient, who gave verbal consent to proceed.  History of Present Illness   [name] is a 53 year old female with hypertension and diabetes who presents for follow-up on her blood pressure management and medication refills.  She is on metoprolol  50 mg once daily and lisinopril  40 mg once daily for hypertension. She has been adherent to her medication regimen for the past two weeks but has not monitored her blood pressure at home. She requests refills for her blood pressure medications.  She has been on metformin  for diabetes management for about ten years. After a lapse of three to four weeks, she has resumed taking metformin  once daily. Her last A1c was 5.6 two weeks ago.   Recent lab work indicates normal thyroid, kidney, and liver function tests, with no anemia. Her vitamin D  level was very low, and she has started a regimen of 50,000 units once a week for twelve weeks.  She does not have a primary care provider and wishes to establish care. She attempted to schedule a mammogram but lost the message. She has no other concerns today.      Past Medical History:  Diagnosis Date   Constipation    Depression    History of hepatomegaly    Hypertension    Social History   Socioeconomic History   Marital status: Single    Spouse name: Not on file   Number of children: Not on file   Years of education: Not on file   Highest education level: Not on file  Occupational History   Not on file  Tobacco Use   Smoking status: Every Day    Current packs/day: 0.50    Types: Cigarettes   Smokeless tobacco: Never  Vaping Use   Vaping status: Former  Substance and Sexual Activity   Alcohol use: No    Drug use: No   Sexual activity: Not on file  Other Topics Concern   Not on file  Social History Narrative   Not on file   Social Drivers of Health   Financial Resource Strain: Not on file  Food Insecurity: Not on file  Transportation Needs: Not on file  Physical Activity: Not on file  Stress: Not on file  Social Connections: Not on file  Intimate Partner Violence: Not on file   Family History  Problem Relation Age of Onset   Heart failure Mother    No Known Allergies  Review of Systems  Constitutional: Negative.   HENT: Negative.    Eyes: Negative.   Respiratory:  Negative for shortness of breath.   Cardiovascular:  Negative for chest pain.  Gastrointestinal: Negative.   Genitourinary: Negative.   Musculoskeletal: Negative.   Skin: Negative.   Neurological: Negative.   Endo/Heme/Allergies: Negative.   Psychiatric/Behavioral: Negative.        Objective:     BP (!) 163/103 (BP Location: Left Arm, Patient Position: Sitting, Cuff Size: Large)   Pulse 79   Ht 5' 3 (1.6 m)   LMP 05/27/2021 (Approximate)   SpO2 96%   BMI 47.83 kg/m  BP Readings from Last 3 Encounters:  06/19/23 (!) 163/103  06/05/23 (!) 177/106  04/05/23 (!) 161/110  Wt Readings from Last 3 Encounters:  06/05/23 270 lb (122.5 kg)  11/07/15 300 lb (136.1 kg)  09/19/15 300 lb (136.1 kg)    Physical Exam Vitals and nursing note reviewed.  Constitutional:      Appearance: Normal appearance.  HENT:     Head: Normocephalic and atraumatic.     Right Ear: External ear normal.     Left Ear: External ear normal.     Nose: Nose normal.     Mouth/Throat:     Mouth: Mucous membranes are moist.     Pharynx: Oropharynx is clear.  Eyes:     Extraocular Movements: Extraocular movements intact.     Conjunctiva/sclera: Conjunctivae normal.     Pupils: Pupils are equal, round, and reactive to light.  Cardiovascular:     Rate and Rhythm: Normal rate and regular rhythm.     Pulses: Normal pulses.      Heart sounds: Normal heart sounds.  Pulmonary:     Effort: Pulmonary effort is normal.     Breath sounds: Normal breath sounds.  Musculoskeletal:        General: Normal range of motion.     Cervical back: Normal range of motion and neck supple.  Skin:    General: Skin is warm and dry.  Neurological:     General: No focal deficit present.     Mental Status: She is alert and oriented to person, place, and time.  Psychiatric:        Mood and Affect: Mood normal.        Behavior: Behavior normal.        Thought Content: Thought content normal.        Judgment: Judgment normal.        Assessment & Plan:   Problem List Items Addressed This Visit       Cardiovascular and Mediastinum   Essential hypertension - Primary   Relevant Medications   metoprolol  succinate (TOPROL -XL) 50 MG 24 hr tablet   lisinopril  (ZESTRIL ) 40 MG tablet   hydrochlorothiazide (HYDRODIURIL) 25 MG tablet   Other Visit Diagnoses       Vitamin D  deficiency         Elevated blood pressure reading in office with diagnosis of hypertension       Relevant Medications   metoprolol  succinate (TOPROL -XL) 50 MG 24 hr tablet   lisinopril  (ZESTRIL ) 40 MG tablet   hydrochlorothiazide (HYDRODIURIL) 25 MG tablet     1. Essential hypertension (Primary) Continue metoprolol  and lisinopril .  Add hydrochlorothiazide.  Patient encouraged to check blood pressure at home, keep a written log and have available for all office visits.  Patient given appointment to establish care at [facility] at [facility].  Red flags given for prompt reevaluation. - metoprolol  succinate (TOPROL -XL) 50 MG 24 hr tablet; Take 1 tablet (50 mg total) by mouth daily.  Dispense: 30 tablet; Refill: 0 - lisinopril  (ZESTRIL ) 40 MG tablet; Take 1 tablet (40 mg total) by mouth daily.  Dispense: 30 tablet; Refill: 0 - hydrochlorothiazide (HYDRODIURIL) 25 MG tablet; Take 1 tablet (25 mg total) by mouth daily.  Dispense: 30 tablet; Refill: 1  2. Vitamin D   deficiency Reviewed lab results with patient, encouraged continued compliance  3. Elevated blood pressure reading in office with diagnosis of hypertension  Patient given information to be able to contact the breast center to schedule her mammogram.  I have reviewed the patient's medical history (PMH, PSH, Social History, Family History, Medications, and allergies) , and  have been updated if relevant. I spent 30 minutes reviewing chart and  face to face time with patient.     Return in about 12 days (around 07/01/2023) for with Lavona Pounds, NP at [facility] at [facility].    Etter Hermann Mayers, PA-C

## 2023-06-19 NOTE — Patient Instructions (Addendum)
 Please call to schedule your mammogram  [facility]   [address], [place], [place] [address] Phone: [phone]  VISIT SUMMARY:  Today, we discussed your blood pressure management, diabetes control, and vitamin D  deficiency. We also talked about general health maintenance, including scheduling a mammogram and establishing care with a primary care provider.  YOUR PLAN:  -HYPERTENSION: Hypertension means high blood pressure. Your blood pressure is currently not well-controlled at 161/108 mmHg. We will add hydrochlorothiazide to your regimen to help lower your blood pressure. Continue taking metoprolol  and lisinopril  as prescribed. Please monitor your blood pressure at home and keep a log of your readings.  -TYPE 2 DIABETES MELLITUS: Type 2 Diabetes Mellitus is a condition where your body does not use insulin properly, leading to high blood sugar levels. Your diabetes is well-controlled with an A1c of 5.6%. Continue taking metformin  once daily with breakfast.  -VITAMIN D  DEFICIENCY: Vitamin D  deficiency means you have low levels of vitamin D , which is important for bone health. Continue taking 50,000 units of vitamin D  once a week for 12 weeks. We will recheck your vitamin D  levels after 12 weeks.  Health Maintenance, Female Adopting a healthy lifestyle and getting preventive care are important in promoting health and wellness. Ask your health care provider about: The right schedule for you to have regular tests and exams. Things you can do on your own to prevent diseases and keep yourself healthy. What should I know about diet, weight, and exercise? Eat a healthy diet  Eat a diet that includes plenty of vegetables, fruits, low-fat dairy products, and lean protein. Do not eat a lot of foods that are high in solid fats, added sugars, or sodium. Maintain a healthy weight Body mass index (BMI) is used to identify weight problems. It estimates body fat based on height and weight. Your  health care provider can help determine your BMI and help you achieve or maintain a healthy weight. Get regular exercise Get regular exercise. This is one of the most important things you can do for your health. Most adults should: Exercise for at least 150 minutes each week. The exercise should increase your heart rate and make you sweat (moderate-intensity exercise). Do strengthening exercises at least twice a week. This is in addition to the moderate-intensity exercise. Spend less time sitting. Even light physical activity can be beneficial. Watch cholesterol and blood lipids Have your blood tested for lipids and cholesterol at 52 years of age, then have this test every 5 years. Have your cholesterol levels checked more often if: Your lipid or cholesterol levels are high. You are older than 52 years of age. You are at high risk for heart disease. What should I know about cancer screening? Depending on your health history and family history, you may need to have cancer screening at various ages. This may include screening for: Breast cancer. Cervical cancer. Colorectal cancer. Skin cancer. Lung cancer. What should I know about heart disease, diabetes, and high blood pressure? Blood pressure and heart disease High blood pressure causes heart disease and increases the risk of stroke. This is more likely to develop in people who have high blood pressure readings or are overweight. Have your blood pressure checked: Every 3-5 years if you are 8-57 years of age. Every year if you are 51 years old or older. Diabetes Have regular diabetes screenings. This checks your fasting blood sugar level. Have the screening done: Once every three years after age 74 if you are  at a normal weight and have a low risk for diabetes. More often and at a younger age if you are overweight or have a high risk for diabetes. What should I know about preventing infection? Hepatitis B If you have a higher risk for  hepatitis B, you should be screened for this virus. Talk with your health care provider to find out if you are at risk for hepatitis B infection. Hepatitis C Testing is recommended for: Everyone born from [birth year] through 1964. Anyone with known risk factors for hepatitis C. Sexually transmitted infections (STIs) Get screened for STIs, including gonorrhea and chlamydia, if: You are sexually active and are younger than 52 years of age. You are older than 52 years of age and your health care provider tells you that you are at risk for this type of infection. Your sexual activity has changed since you were last screened, and you are at increased risk for chlamydia or gonorrhea. Ask your health care provider if you are at risk. Ask your health care provider about whether you are at high risk for HIV. Your health care provider may recommend a prescription medicine to help prevent HIV infection. If you choose to take medicine to prevent HIV, you should first get tested for HIV. You should then be tested every 3 months for as long as you are taking the medicine. Pregnancy If you are about to stop having your period (premenopausal) and you may become pregnant, seek counseling before you get pregnant. Take 400 to 800 micrograms (mcg) of folic acid every day if you become pregnant. Ask for birth control (contraception) if you want to prevent pregnancy. Osteoporosis and menopause Osteoporosis is a disease in which the bones lose minerals and strength with aging. This can result in bone fractures. If you are 47 years old or older, or if you are at risk for osteoporosis and fractures, ask your health care provider if you should: Be screened for bone loss. Take a calcium or vitamin D  supplement to lower your risk of fractures. Be given hormone replacement therapy (HRT) to treat symptoms of menopause. Follow these instructions at home: Alcohol use Do not drink alcohol if: Your health care provider tells you not  to drink. You are pregnant, may be pregnant, or are planning to become pregnant. If you drink alcohol: Limit how much you have to: 0-1 drink a day. Know how much alcohol is in your drink. In the [place] one drink equals one 12 oz bottle of beer (355 mL), one 5 oz glass of wine (148 mL), or one 1 oz glass of hard liquor (44 mL). Lifestyle Do not use any products that contain nicotine or tobacco. These products include cigarettes, chewing tobacco, and vaping devices, such as e-cigarettes. If you need help quitting, ask your health care provider. Do not use street drugs. Do not share needles. Ask your health care provider for help if you need support or information about quitting drugs. General instructions Schedule regular health, dental, and eye exams. Stay current with your vaccines. Tell your health care provider if: You often feel depressed. You have ever been abused or do not feel safe at home. Summary Adopting a healthy lifestyle and getting preventive care are important in promoting health and wellness. Follow your health care provider's instructions about healthy diet, exercising, and getting tested or screened for diseases. Follow your health care provider's instructions on monitoring your cholesterol and blood pressure. This information is not intended to replace advice given to you by  your health care provider. Make sure you discuss any questions you have with your health care provider. Document Revised: 05/16/2020 Document Reviewed: 05/16/2020 Elsevier Patient Education  2024 [employer].

## 2023-06-30 ENCOUNTER — Other Ambulatory Visit: Payer: Self-pay | Admitting: Physician Assistant

## 2023-06-30 DIAGNOSIS — E119 Type 2 diabetes mellitus without complications: Secondary | ICD-10-CM

## 2023-07-01 ENCOUNTER — Encounter: Payer: MEDICAID | Admitting: Family

## 2023-07-01 NOTE — Progress Notes (Signed)
 Erroneous encounter-disregard

## 2023-07-15 ENCOUNTER — Other Ambulatory Visit: Payer: Self-pay | Admitting: Physician Assistant

## 2023-07-15 DIAGNOSIS — I10 Essential (primary) hypertension: Secondary | ICD-10-CM

## 2023-08-02 ENCOUNTER — Other Ambulatory Visit: Payer: Self-pay | Admitting: Physician Assistant

## 2023-08-02 DIAGNOSIS — I10 Essential (primary) hypertension: Secondary | ICD-10-CM

## 2023-08-08 ENCOUNTER — Ambulatory Visit (HOSPITAL_COMMUNITY)
Admission: EM | Admit: 2023-08-08 | Discharge: 2023-08-08 | Disposition: A | Discharge status: Home / Self Care | Payer: MEDICAID | Attending: Emergency Medicine | Admitting: Emergency Medicine

## 2023-08-08 ENCOUNTER — Encounter (HOSPITAL_COMMUNITY): Payer: Self-pay

## 2023-08-08 DIAGNOSIS — W19XXXA Unspecified fall, initial encounter: Secondary | ICD-10-CM

## 2023-08-08 DIAGNOSIS — I10 Essential (primary) hypertension: Secondary | ICD-10-CM

## 2023-08-08 DIAGNOSIS — S20211A Contusion of right front wall of thorax, initial encounter: Secondary | ICD-10-CM

## 2023-08-08 MED ORDER — HYDROCHLOROTHIAZIDE 25 MG PO TABS: 25.0000 mg | ORAL_TABLET | Freq: Every day | ORAL | 1 refills | Status: DC

## 2023-08-08 MED ORDER — LISINOPRIL 40 MG PO TABS: 40.0000 mg | ORAL_TABLET | Freq: Every day | ORAL | 0 refills | Status: DC

## 2023-08-08 MED ORDER — METHOCARBAMOL 500 MG PO TABS
500.0000 mg | ORAL_TABLET | Freq: Two times a day (BID) | ORAL | 0 refills | Status: AC
Start: 2023-08-08 — End: ?

## 2023-08-08 MED ORDER — METOPROLOL SUCCINATE ER 50 MG PO TB24: 50.0000 mg | ORAL_TABLET | Freq: Every day | ORAL | 0 refills | Status: DC

## 2023-08-08 NOTE — ED Provider Notes (Signed)
 [facility]    CSN: [id number] Arrival date & time: 08/08/23  9192      History   Chief Complaint Chief Complaint  Patient presents with   Fall    HPI [name] is a 53 y.o. female.   Patient presents to clinic over concern of right sided rib cage pain she sustained after a fall 2 days ago.  She does have abrasion to her right elbow and left forearm.  She did take BC powders with some relief.  Pain will be increased with deep breathing or coughing.  Has not had any bruising.  Denies wheezing or shortness of breath.  She is also requesting a refill of her blood pressure medication.  Blood pressure is elevated today in clinic.  Reports she has enough blood pressure medication to last her for the next few days.  Has not scheduled with a primary care provider at this time.  Endorses compliance with hypertension medication.  Has not had any vision changes, headache or chest pain.  The history is provided by the patient and medical records.  Fall    Past Medical History:  Diagnosis Date   Constipation    Depression    History of hepatomegaly    Hypertension     Patient Active Problem List   Diagnosis Date Noted   Essential hypertension 06/05/2023    Past Surgical History:  Procedure Laterality Date   CESAREAN SECTION      OB History   No obstetric history on file.      Home Medications    Prior to Admission medications   Medication Sig Start Date End Date Taking? Authorizing Provider  methocarbamol  (ROBAXIN ) 500 MG tablet Take 1 tablet (500 mg total) by mouth 2 (two) times daily. 08/08/23  Yes Donta Fuster  N, FNP  Aspirin-Salicylamide-Caffeine (BC HEADACHE POWDER PO) Take 1 packet by mouth as needed (for pain).    [provider]  atorvastatin (LIPITOR) 20 MG tablet Take 20 mg by mouth daily. 09/04/16   [provider]  FLUoxetine (PROZAC) 40 MG capsule Take 40 mg by mouth every morning. 05/08/23   [provider]   hydrochlorothiazide  (HYDRODIURIL ) 25 MG tablet Take 1 tablet (25 mg total) by mouth daily. 08/08/23   Dreama, Zayaan Kozak  N, FNP  hydrOXYzine (VISTARIL) 25 MG capsule Take 25 mg by mouth 4 (four) times daily as needed. 05/08/23   [provider]  lisinopril  (ZESTRIL ) 40 MG tablet Take 1 tablet (40 mg total) by mouth daily. 08/08/23   Dreama, Heavyn Yearsley  N, FNP  metFORMIN  (GLUCOPHAGE -XR) 500 MG 24 hr tablet TAKE 1 TABLET BY MOUTH EVERY DAY WITH BREAKFAST 07/01/23   Mayers, Cari S, PA-C  metoprolol  succinate (TOPROL -XL) 50 MG 24 hr tablet Take 1 tablet (50 mg total) by mouth daily. 08/08/23   Dreama, Valerya Maxton  N, FNP  risperiDONE (RISPERDAL) 1 MG tablet Take 1 mg by mouth at bedtime. 01/22/15   [provider]  sertraline (ZOLOFT) 100 MG tablet Take 200 mg by mouth daily with breakfast. 12/24/14   [provider]  Vitamin D , Ergocalciferol , (DRISDOL ) 1.25 MG (50000 UNIT) CAPS capsule Take 1 capsule (50,000 Units total) by mouth every 7 (seven) days. 06/06/23   Mayers, Kirk RAMAN, PA-C    Family History Family History  Problem Relation Age of Onset   Heart failure Mother     Social History Social History   Tobacco Use   Smoking status: Every Day    Current packs/day: 0.50  Types: Cigarettes   Smokeless tobacco: Never  Vaping Use   Vaping status: Former  Substance Use Topics   Alcohol use: No   Drug use: No     Allergies   Patient has no known allergies.   Review of Systems Review of Systems  Per HPI  Physical Exam Triage Vital Signs ED Triage Vitals  Encounter Vitals Group     BP 08/08/23 0840 (!) 191/108     Girls Systolic BP Percentile --      Girls Diastolic BP Percentile --      Boys Systolic BP Percentile --      Boys Diastolic BP Percentile --      Pulse Rate 08/08/23 0840 78     Resp 08/08/23 0840 18     Temp 08/08/23 0840 98.4 F (36.9 C)     Temp Source 08/08/23 0840 Oral     SpO2 08/08/23 0840 95 %     Weight --      Height --      Head  Circumference --      Peak Flow --      Pain Score 08/08/23 0841 6     Pain Loc --      Pain Education --      Exclude from Growth Chart --    No data found.  Updated Vital Signs BP (!) 191/108 (BP Location: Left Arm)   Pulse 78   Temp 98.4 F (36.9 C) (Oral)   Resp 18   LMP 05/27/2021 (Approximate)   SpO2 95%   Visual Acuity Right Eye Distance:   Left Eye Distance:   Bilateral Distance:    Right Eye Near:   Left Eye Near:    Bilateral Near:     Physical Exam Vitals and nursing note reviewed.  Constitutional:      Appearance: Normal appearance.  HENT:     Head: Normocephalic and atraumatic.     Right Ear: External ear normal.     Left Ear: External ear normal.     Nose: Nose normal.     Mouth/Throat:     Mouth: Mucous membranes are moist.  Eyes:     Conjunctiva/sclera: Conjunctivae normal.  Cardiovascular:     Rate and Rhythm: Normal rate.  Pulmonary:     Effort: Pulmonary effort is normal. No respiratory distress.  Skin:    General: Skin is warm and dry.  Neurological:     General: No focal deficit present.     Mental Status: She is alert and oriented to person, place, and time.  Psychiatric:        Mood and Affect: Mood normal.        Behavior: Behavior normal. Behavior is cooperative.      UC Treatments / Results  Labs (all labs ordered are listed, but only abnormal results are displayed) Labs Reviewed - No data to display  EKG   Radiology No results found.  Procedures Procedures (including critical care time)  Medications Ordered in UC Medications - No data to display  Initial Impression / Assessment and Plan / UC Course  I have reviewed the triage vital signs and the nursing notes.  Pertinent labs & imaging results that were available during my care of the patient were reviewed by me and considered in my medical decision making (see chart for details).  Vitals and triage reviewed, patient is hemodynamically stable.  Right-sided rib  cage tenderness to palpation, no crepitus, deformity or bruising.  Lungs vesicular,  heart with regular rate and rhythm.  Discussed limitation of x-ray with mild nondisplaced rib fractures, will treat symptomatically with muscle relaxers and splinting.  Low concern for displaced fracture at this time.  Pain has been improving over the past few days.  Hypertensive in clinic without signs of hypertensive urgency or emergency.  Will refill blood pressure medication and encourage PCP follow-up for further management of chronic healthcare conditions.  Plan of care, follow-up care return precautions given, no questions at this time.    Final Clinical Impressions(s) / UC Diagnoses   Final diagnoses:  Essential hypertension  Rib contusion, right, initial encounter  Fall, initial encounter     Discharge Instructions      For your rib cage pain you can splint the ribs with a pillow whenever you cough or sneeze.  Use the muscle relaxer up to 2 times daily to help with pain, this medication may cause drowsiness.  You can heat or ice to the area.  Ribs do take a while to heal, may take several weeks.  Take your blood pressure medication as prescribed.  Follow-up with your primary care provider for any further refills.  Please check your blood pressure at home and bring a log with you to your appointment to make sure that we are adequately managing your blood pressure.  Seek immediate care for any chest pain, vision changes, severe headache, or new concerning symptoms.     ED Prescriptions     Medication Sig Dispense Auth. Provider   methocarbamol  (ROBAXIN ) 500 MG tablet Take 1 tablet (500 mg total) by mouth 2 (two) times daily. 20 tablet Dreama, Alizae Bechtel  N, FNP   hydrochlorothiazide  (HYDRODIURIL ) 25 MG tablet Take 1 tablet (25 mg total) by mouth daily. 30 tablet Dreama, Keilly Fatula  N, FNP   lisinopril  (ZESTRIL ) 40 MG tablet Take 1 tablet (40 mg total) by mouth daily. 30 tablet Dreama, Kadance Mccuistion  N,  FNP   metoprolol  succinate (TOPROL -XL) 50 MG 24 hr tablet Take 1 tablet (50 mg total) by mouth daily. 30 tablet Dreama, Matison Nuccio  N, FNP      PDMP not reviewed this encounter.   Dreama, Blayre Papania  N, FNP 08/08/23 [phone]

## 2023-08-08 NOTE — Discharge Instructions (Signed)
 For your rib cage pain you can splint the ribs with a pillow whenever you cough or sneeze.  Use the muscle relaxer up to 2 times daily to help with pain, this medication may cause drowsiness.  You can heat or ice to the area.  Ribs do take a while to heal, may take several weeks.  Take your blood pressure medication as prescribed.  Follow-up with your primary care provider for any further refills.  Please check your blood pressure at home and bring a log with you to your appointment to make sure that we are adequately managing your blood pressure.  Seek immediate care for any chest pain, vision changes, severe headache, or new concerning symptoms.

## 2023-08-08 NOTE — ED Triage Notes (Signed)
 Pt states fell 2 days ago and having pain to rt ribs. States took BC powders with relief. Pt requesting refill on her meds. Pt's B/P is elevated today and requesting medication today to help bring it down like they give her at the mobile clinic. States has been taking her meds and has a few days left.

## 2023-08-28 ENCOUNTER — Other Ambulatory Visit: Payer: Self-pay | Admitting: Physician Assistant

## 2023-08-28 DIAGNOSIS — E559 Vitamin D deficiency, unspecified: Secondary | ICD-10-CM

## 2023-09-10 ENCOUNTER — Ambulatory Visit (HOSPITAL_COMMUNITY)
Admission: EM | Admit: 2023-09-10 | Discharge: 2023-09-10 | Disposition: A | Discharge status: Home / Self Care | Payer: MEDICAID

## 2023-09-10 ENCOUNTER — Encounter (HOSPITAL_COMMUNITY): Payer: Self-pay

## 2023-09-10 DIAGNOSIS — I10 Essential (primary) hypertension: Secondary | ICD-10-CM

## 2023-09-10 DIAGNOSIS — Z760 Encounter for issue of repeat prescription: Secondary | ICD-10-CM

## 2023-09-10 DIAGNOSIS — E119 Type 2 diabetes mellitus without complications: Secondary | ICD-10-CM

## 2023-09-10 MED ORDER — LISINOPRIL 40 MG PO TABS: 40.0000 mg | ORAL_TABLET | Freq: Every day | ORAL | 2 refills | Status: DC

## 2023-09-10 MED ORDER — METFORMIN HCL ER 500 MG PO TB24: 500.0000 mg | ORAL_TABLET | Freq: Every day | ORAL | 1 refills | Status: AC

## 2023-09-10 MED ORDER — METOPROLOL SUCCINATE ER 50 MG PO TB24: 50.0000 mg | ORAL_TABLET | Freq: Every day | ORAL | 2 refills | Status: DC

## 2023-09-10 MED ORDER — HYDROCHLOROTHIAZIDE 25 MG PO TABS: 25.0000 mg | ORAL_TABLET | Freq: Every day | ORAL | 2 refills | Status: DC

## 2023-09-10 NOTE — ED Provider Notes (Signed)
 [facility]   Note:  This document was prepared using Dragon voice recognition software and may include unintentional dictation errors.  MRN: [id number] DOB: 1970/04/27  Subjective:   [name] is a 53 y.o. female presenting for refill of prescription medications.  Patient reports that she usually goes to [facility] but did not have any mobile health clinic availability for the next few days.  Patient states she ran out of blood pressure medication yesterday.  Patient also requesting refill of metformin  for diabetes.  No chest pain, shortness of breath, weakness, dizziness, nausea/vomiting, abdominal pain.  Patient denies any other medical concern at this time  No current facility-administered medications for this encounter.  Current Outpatient Medications:    Aspirin-Salicylamide-Caffeine (BC HEADACHE POWDER PO), Take 1 packet by mouth as needed (for pain)., Disp: , Rfl:    atorvastatin (LIPITOR) 20 MG tablet, Take 20 mg by mouth daily., Disp: , Rfl: 1   FLUoxetine (PROZAC) 40 MG capsule, Take 40 mg by mouth every morning., Disp: , Rfl:    hydrochlorothiazide  (HYDRODIURIL ) 25 MG tablet, Take 1 tablet (25 mg total) by mouth daily., Disp: 30 tablet, Rfl: 2   hydrOXYzine (VISTARIL) 25 MG capsule, Take 25 mg by mouth 4 (four) times daily as needed., Disp: , Rfl:    lisinopril  (ZESTRIL ) 40 MG tablet, Take 1 tablet (40 mg total) by mouth daily., Disp: 30 tablet, Rfl: 2   metFORMIN  (GLUCOPHAGE -XR) 500 MG 24 hr tablet, Take 1 tablet (500 mg total) by mouth daily with breakfast., Disp: 90 tablet, Rfl: 1   methocarbamol  (ROBAXIN ) 500 MG tablet, Take 1 tablet (500 mg total) by mouth 2 (two) times daily., Disp: 20 tablet, Rfl: 0   metoprolol  succinate (TOPROL -XL) 50 MG 24 hr tablet, Take 1 tablet (50 mg total) by mouth daily., Disp: 30 tablet, Rfl: 2   risperiDONE (RISPERDAL) 1 MG tablet, Take 1 mg by mouth at bedtime., Disp: , Rfl: 2   sertraline (ZOLOFT) 100 MG tablet,  Take 200 mg by mouth daily with breakfast., Disp: , Rfl: 2   Vitamin D , Ergocalciferol , (DRISDOL ) 1.25 MG (50000 UNIT) CAPS capsule, Take 1 capsule (50,000 Units total) by mouth every 7 (seven) days., Disp: 4 capsule, Rfl: 2   No Known Allergies  Past Medical History:  Diagnosis Date   Constipation    Depression    History of hepatomegaly    Hypertension      Past Surgical History:  Procedure Laterality Date   CESAREAN SECTION      Family History  Problem Relation Age of Onset   Heart failure Mother     Social History   Tobacco Use   Smoking status: Every Day    Current packs/day: 0.50    Types: Cigarettes   Smokeless tobacco: Never  Vaping Use   Vaping status: Former  Substance Use Topics   Alcohol use: No   Drug use: No    ROS Refer to HPI for ROS details.  Objective:   Vitals: BP (!) 165/111 (BP Location: Left Arm)   Pulse 84   Temp 98.3 F (36.8 C) (Oral)   Resp 18   LMP 05/27/2021 (Approximate)   SpO2 98%   Physical Exam Vitals and nursing note reviewed.  Constitutional:      General: She is not in acute distress.    Appearance: Normal appearance. She is well-developed. She is not ill-appearing or toxic-appearing.  HENT:     Head: Normocephalic and atraumatic.  Eyes:  General:        Right eye: No discharge.        Left eye: No discharge.     Extraocular Movements: Extraocular movements intact.     Conjunctiva/sclera: Conjunctivae normal.  Cardiovascular:     Rate and Rhythm: Normal rate.  Pulmonary:     Effort: Pulmonary effort is normal. No respiratory distress.     Breath sounds: No stridor. No wheezing.  Chest:     Chest wall: No tenderness.  Skin:    General: Skin is warm and dry.  Neurological:     General: No focal deficit present.     Mental Status: She is alert and oriented to person, place, and time.  Psychiatric:        Mood and Affect: Mood normal.        Behavior: Behavior normal.     Procedures  No results found  for this or any previous visit (from the past 24 hours).  No results found.   Assessment and Plan :     Discharge Instructions       1. Medication refill (Primary) - hydrochlorothiazide  (HYDRODIURIL ) 25 MG tablet; Take 1 tablet (25 mg total) by mouth daily.  Dispense: 30 tablet; Refill: 2 - lisinopril  (ZESTRIL ) 40 MG tablet; Take 1 tablet (40 mg total) by mouth daily.  Dispense: 30 tablet; Refill: 2 - metoprolol  succinate (TOPROL -XL) 50 MG 24 hr tablet; Take 1 tablet (50 mg total) by mouth daily.  Dispense: 30 tablet; Refill: 2 - metFORMIN  (GLUCOPHAGE -XR) 500 MG 24 hr tablet; Take 1 tablet (500 mg total) by mouth daily with breakfast.  Dispense: 90 tablet; Refill: 1 - Follow-up next week with mobile health clinic as needed for evaluation of medication effectiveness. -Continue to monitor symptoms for any change in severity if there is any escalation of current symptoms or development of new symptoms follow-up in ER for further evaluation and management.      Ahria Slappey B Atreus Hasz   Arvine Clayburn, Rachel B, [place] 09/10/23 1047

## 2023-09-10 NOTE — ED Triage Notes (Signed)
 Pt requesting b/p medication refill. States has been out for one day. States usually goes to mobile clinic but didn't find one.

## 2023-09-10 NOTE — Discharge Instructions (Signed)
  1. Medication refill (Primary) - hydrochlorothiazide  (HYDRODIURIL ) 25 MG tablet; Take 1 tablet (25 mg total) by mouth daily.  Dispense: 30 tablet; Refill: 2 - lisinopril  (ZESTRIL ) 40 MG tablet; Take 1 tablet (40 mg total) by mouth daily.  Dispense: 30 tablet; Refill: 2 - metoprolol  succinate (TOPROL -XL) 50 MG 24 hr tablet; Take 1 tablet (50 mg total) by mouth daily.  Dispense: 30 tablet; Refill: 2 - metFORMIN  (GLUCOPHAGE -XR) 500 MG 24 hr tablet; Take 1 tablet (500 mg total) by mouth daily with breakfast.  Dispense: 90 tablet; Refill: 1 - Follow-up next week with mobile health clinic as needed for evaluation of medication effectiveness. -Continue to monitor symptoms for any change in severity if there is any escalation of current symptoms or development of new symptoms follow-up in ER for further evaluation and management.

## 2023-11-21 ENCOUNTER — Ambulatory Visit (HOSPITAL_COMMUNITY)
Admission: EM | Admit: 2023-11-21 | Discharge: 2023-11-21 | Disposition: A | Discharge status: Home / Self Care | Payer: MEDICAID | Attending: Family Medicine | Admitting: Family Medicine

## 2023-11-21 ENCOUNTER — Other Ambulatory Visit: Payer: Self-pay

## 2023-11-21 ENCOUNTER — Encounter (HOSPITAL_COMMUNITY): Payer: Self-pay | Admitting: *Deleted

## 2023-11-21 DIAGNOSIS — N898 Other specified noninflammatory disorders of vagina: Secondary | ICD-10-CM | POA: Diagnosis not present

## 2023-11-21 DIAGNOSIS — Z760 Encounter for issue of repeat prescription: Secondary | ICD-10-CM | POA: Insufficient documentation

## 2023-11-21 DIAGNOSIS — I10 Essential (primary) hypertension: Secondary | ICD-10-CM | POA: Insufficient documentation

## 2023-11-21 LAB — POCT URINALYSIS DIP (MANUAL ENTRY)
Bilirubin, UA: NEGATIVE
Glucose, UA: NEGATIVE mg/dL
Ketones, POC UA: NEGATIVE mg/dL
Leukocytes, UA: NEGATIVE
Nitrite, UA: NEGATIVE
Protein Ur, POC: NEGATIVE mg/dL
Spec Grav, UA: 1.025 (ref 1.010–1.025)
Urobilinogen, UA: 0.2 U/dL
pH, UA: 5.5 (ref 5.0–8.0)

## 2023-11-21 MED ORDER — ATORVASTATIN CALCIUM 20 MG PO TABS: 10.0000 mg | ORAL_TABLET | Freq: Every day | ORAL | 0 refills | Status: DC

## 2023-11-21 MED ORDER — METOPROLOL SUCCINATE ER 50 MG PO TB24: 50.0000 mg | ORAL_TABLET | Freq: Every day | ORAL | 0 refills | Status: AC

## 2023-11-21 MED ORDER — ATORVASTATIN CALCIUM 20 MG PO TABS: 20.0000 mg | ORAL_TABLET | Freq: Every day | ORAL | 0 refills | Status: AC

## 2023-11-21 MED ORDER — LISINOPRIL 40 MG PO TABS: 40.0000 mg | ORAL_TABLET | Freq: Every day | ORAL | 0 refills | Status: AC

## 2023-11-21 MED ORDER — HYDROCHLOROTHIAZIDE 25 MG PO TABS
25.0000 mg | ORAL_TABLET | Freq: Every day | ORAL | 0 refills | Status: AC
Start: 2023-11-21 — End: ?

## 2023-11-21 NOTE — Discharge Instructions (Addendum)
 I have refilled your blood pressure medication as well as your atorvastatin.  Ensure you are taking these as prescribed.  Follow-up with the mobile health clinic or with a primary care provider within the next 30 days for further management of your chronic health conditions.  The vaginal swab will be back over the next few days and you will be called if the swab is abnormal to initiate the appropriate treatment.  Abstain from intercourse until all results have been received.  Return to clinic for any new or urgent symptoms.

## 2023-11-21 NOTE — ED Triage Notes (Signed)
 PT reports having vag.discharge for one week. Pt also needs a refill on BP meds. Pt needs Metoprolol ,hydrochlorothiazide ,lisinopril  refilled

## 2023-11-21 NOTE — ED Provider Notes (Signed)
 [facility]    CSN: [id number] Arrival date & time: 11/21/23  9185      History   Chief Complaint Chief Complaint  Patient presents with   Medication Refill   Vaginal Discharge    HPI [name] is a 53 y.o. female.   Patient presents to clinic over concern of increased vaginal discharge for the past week.  Denies foul odor or vaginal itching.  She was sexually active without protection last month.  She is postmenopausal.  Has not had any dysuria, urgency, frequency or abdominal pain.  Without fevers.  She has a history of hypertension.  Does need a refill of her metoprolol , hydrochlorothiazide , lisinopril  and atorvastatin.  She has plenty of metformin .  [facility], is unsure what their schedule was so she just came to clinic.  Has not had vision changes, chest pain or severe headache.  Reports running out of blood pressure medication yesterday.  The history is provided by the patient and medical records.  Medication Refill Vaginal Discharge   Past Medical History:  Diagnosis Date   Constipation    Depression    History of hepatomegaly    Hypertension     Patient Active Problem List   Diagnosis Date Noted   Essential hypertension 06/05/2023    Past Surgical History:  Procedure Laterality Date   CESAREAN SECTION      OB History   No obstetric history on file.      Home Medications    Prior to Admission medications   Medication Sig Start Date End Date Taking? Authorizing Provider  FLUoxetine (PROZAC) 40 MG capsule Take 40 mg by mouth every morning. 05/08/23  Yes [provider]  hydrochlorothiazide  (HYDRODIURIL ) 25 MG tablet Take 1 tablet (25 mg total) by mouth daily. 11/21/23  Yes Bunyan Brier  N, FNP  hydrOXYzine (VISTARIL) 25 MG capsule Take 25 mg by mouth 4 (four) times daily as needed. 05/08/23  Yes [provider]  lisinopril  (ZESTRIL ) 40 MG tablet Take 1 tablet (40 mg  total) by mouth daily. 11/21/23  Yes Regana Kemple  N, FNP  metFORMIN  (GLUCOPHAGE -XR) 500 MG 24 hr tablet Take 1 tablet (500 mg total) by mouth daily with breakfast. 09/10/23  Yes Reddick, Johnathan B, NP  metoprolol  succinate (TOPROL  XL) 50 MG 24 hr tablet Take 1 tablet (50 mg total) by mouth daily. Take with or immediately following a meal. 11/21/23  Yes Adelee Hannula  N, FNP  Aspirin-Salicylamide-Caffeine (BC HEADACHE POWDER PO) Take 1 packet by mouth as needed (for pain).    [provider]  atorvastatin (LIPITOR) 20 MG tablet Take 1 tablet (20 mg total) by mouth daily. 11/21/23   Dreama, Jaxon Flatt  N, FNP  methocarbamol  (ROBAXIN ) 500 MG tablet Take 1 tablet (500 mg total) by mouth 2 (two) times daily. 08/08/23   Dreama, Mechell Girgis  N, FNP  risperiDONE (RISPERDAL) 1 MG tablet Take 1 mg by mouth at bedtime. 01/22/15   [provider]  sertraline (ZOLOFT) 100 MG tablet Take 200 mg by mouth daily with breakfast. 12/24/14   [provider]  Vitamin D , Ergocalciferol , (DRISDOL ) 1.25 MG (50000 UNIT) CAPS capsule Take 1 capsule (50,000 Units total) by mouth every 7 (seven) days. 06/06/23   Mayers, Kirk RAMAN, PA-C    Family History Family History  Problem Relation Age of Onset   Heart failure Mother     Social History Social History   Tobacco Use   Smoking status: Every Day  Current packs/day: 0.50    Types: Cigarettes   Smokeless tobacco: Never  Vaping Use   Vaping status: Former  Substance Use Topics   Alcohol use: No   Drug use: No     Allergies   Patient has no known allergies.   Review of Systems Review of Systems  Per HPI  Physical Exam Triage Vital Signs ED Triage Vitals  Encounter Vitals Group     BP 11/21/23 0835 (!) 160/107     Girls Systolic BP Percentile --      Girls Diastolic BP Percentile --      Boys Systolic BP Percentile --      Boys Diastolic BP Percentile --      Pulse Rate 11/21/23 0835 (!) 109     Resp 11/21/23 0835 18      Temp 11/21/23 0835 98.5 F (36.9 C)     Temp src --      SpO2 11/21/23 0835 96 %     Weight --      Height --      Head Circumference --      Peak Flow --      Pain Score 11/21/23 0832 8     Pain Loc --      Pain Education --      Exclude from Growth Chart --    No data found.  Updated Vital Signs BP (!) 160/107   Pulse (!) 109   Temp 98.5 F (36.9 C)   Resp 18   LMP 05/27/2021 (Approximate)   SpO2 96%   Visual Acuity Right Eye Distance:   Left Eye Distance:   Bilateral Distance:    Right Eye Near:   Left Eye Near:    Bilateral Near:     Physical Exam Vitals and nursing note reviewed.  Constitutional:      Appearance: Normal appearance.  HENT:     Head: Normocephalic and atraumatic.     Right Ear: External ear normal.     Left Ear: External ear normal.     Nose: Nose normal.     Mouth/Throat:     Mouth: Mucous membranes are moist.  Eyes:     Conjunctiva/sclera: Conjunctivae normal.  Cardiovascular:     Rate and Rhythm: Normal rate and regular rhythm.     Heart sounds: Normal heart sounds. No murmur heard. Pulmonary:     Effort: Pulmonary effort is normal. No respiratory distress.     Breath sounds: Normal breath sounds. No wheezing.  Skin:    General: Skin is warm and dry.  Neurological:     General: No focal deficit present.     Mental Status: She is alert and oriented to person, place, and time.  Psychiatric:        Mood and Affect: Mood normal.        Behavior: Behavior normal.      UC Treatments / Results  Labs (all labs ordered are listed, but only abnormal results are displayed) Labs Reviewed  POCT URINALYSIS DIP (MANUAL ENTRY) - Abnormal; Notable for the following components:      Result Value   Clarity, UA turbid (*)    Blood, UA trace-intact (*)    All other components within normal limits  CERVICOVAGINAL ANCILLARY ONLY    EKG   Radiology No results found.  Procedures Procedures (including critical care time)  Medications  Ordered in UC Medications - No data to display  Initial Impression / Assessment and Plan / UC  Course  I have reviewed the triage vital signs and the nursing notes.  Pertinent labs & imaging results that were available during my care of the patient were reviewed by me and considered in my medical decision making (see chart for details).  Vitals and triage reviewed, patient is hemodynamically stable.  Lungs vesicular, heart with regular rate and rhythm.  She is hypertensive without evidence of hypertensive urgency or emergency.  Low concern for PID, afebrile without abdominal pain.  History of BV and trichomonas.  Cytology swab obtained and staff will contact if treatment is needed based on results.  Blood pressure medications refilled.  Provided with mobile clinic calendar and encouraged PCP follow-up for chronic conditions.  Plan of care, follow-up care return precautions given, no questions at this time.     Final Clinical Impressions(s) / UC Diagnoses   Final diagnoses:  Vaginal discharge  Medication refill  Essential hypertension     Discharge Instructions      I have refilled your blood pressure medication as well as your atorvastatin.  Ensure you are taking these as prescribed.  Follow-up with the mobile health clinic or with a primary care provider within the next 30 days for further management of your chronic health conditions.  The vaginal swab will be back over the next few days and you will be called if the swab is abnormal to initiate the appropriate treatment.  Abstain from intercourse until all results have been received.  Return to clinic for any new or urgent symptoms.     ED Prescriptions     Medication Sig Dispense Auth. Provider   hydrochlorothiazide  (HYDRODIURIL ) 25 MG tablet Take 1 tablet (25 mg total) by mouth daily. 30 tablet Dreama, Burech Mcfarland  N, FNP   lisinopril  (ZESTRIL ) 40 MG tablet Take 1 tablet (40 mg total) by mouth daily. 30 tablet Dreama,  Willis Holquin  N, FNP   metoprolol  succinate (TOPROL  XL) 50 MG 24 hr tablet Take 1 tablet (50 mg total) by mouth daily. Take with or immediately following a meal. 30 tablet Dreama, Natan Hartog  N, FNP   atorvastatin (LIPITOR) 20 MG tablet  (Status: Discontinued) Take 0.5 tablets (10 mg total) by mouth daily. 30 tablet Dreama, Destiny Trickey  N, FNP   atorvastatin (LIPITOR) 20 MG tablet Take 1 tablet (20 mg total) by mouth daily. 30 tablet Dreama, Aamya Orellana  N, FNP      PDMP not reviewed this encounter.   Dreama, Blayke Cordrey  N, FNP 11/21/23 [phone]

## 2023-11-22 LAB — CERVICOVAGINAL ANCILLARY ONLY
Bacterial Vaginitis (gardnerella): NEGATIVE
Candida Glabrata: NEGATIVE
Candida Vaginitis: NEGATIVE
Chlamydia: NEGATIVE
Comment: NEGATIVE
Comment: NEGATIVE
Comment: NEGATIVE
Comment: NEGATIVE
Comment: NEGATIVE
Comment: NORMAL
Neisseria Gonorrhea: NEGATIVE
Trichomonas: NEGATIVE

## 2024-02-07 ENCOUNTER — Telehealth: Payer: Self-pay | Admitting: Nurse Practitioner

## 2024-02-07 DIAGNOSIS — Z760 Encounter for issue of repeat prescription: Secondary | ICD-10-CM

## 2024-02-07 DIAGNOSIS — I10 Essential (primary) hypertension: Secondary | ICD-10-CM

## 2024-02-07 NOTE — Progress Notes (Signed)
 Patient report she needs refills for her lisinopril  and metoprolol  just until Tuesday next week. She usually goes to the mobile clinic, but is unable to since she has the flu and car troubles. States her son can pick up her medications for her.  States BP at home this morning was 205/190. Denies chest pain or SOB.  Last seen by PCP 07/2023  Discussed with patient due to BP being severely high, I advise her be seen in person at the nearest ER for further evaluation and treatment. States understanding.   No charge for this visit.   Hadassah Fireman, FNP-C [facility]
